# Patient Record
Sex: Male | Born: 1935 | Race: White | Hispanic: No | Smoking: Former smoker
Health system: Southern US, Community
[De-identification: ages and names within clinical notes are randomized; demographics above are authoritative.]

## PROBLEM LIST (undated history)

## (undated) DIAGNOSIS — Z8673 Personal history of transient ischemic attack (TIA), and cerebral infarction without residual deficits: Secondary | ICD-10-CM

## (undated) DIAGNOSIS — F0390 Unspecified dementia without behavioral disturbance: Secondary | ICD-10-CM

## (undated) DIAGNOSIS — K635 Polyp of colon: Secondary | ICD-10-CM

## (undated) DIAGNOSIS — J45909 Unspecified asthma, uncomplicated: Secondary | ICD-10-CM

## (undated) DIAGNOSIS — C439 Malignant melanoma of skin, unspecified: Secondary | ICD-10-CM

## (undated) DIAGNOSIS — C61 Malignant neoplasm of prostate: Secondary | ICD-10-CM

## (undated) DIAGNOSIS — N200 Calculus of kidney: Secondary | ICD-10-CM

## (undated) DIAGNOSIS — M722 Plantar fascial fibromatosis: Secondary | ICD-10-CM

## (undated) DIAGNOSIS — M949 Disorder of cartilage, unspecified: Secondary | ICD-10-CM

## (undated) DIAGNOSIS — K5790 Diverticulosis of intestine, part unspecified, without perforation or abscess without bleeding: Secondary | ICD-10-CM

## (undated) DIAGNOSIS — L309 Dermatitis, unspecified: Secondary | ICD-10-CM

## (undated) DIAGNOSIS — G4700 Insomnia, unspecified: Secondary | ICD-10-CM

## (undated) DIAGNOSIS — E559 Vitamin D deficiency, unspecified: Secondary | ICD-10-CM

## (undated) DIAGNOSIS — C434 Malignant melanoma of scalp and neck: Secondary | ICD-10-CM

## (undated) DIAGNOSIS — E538 Deficiency of other specified B group vitamins: Secondary | ICD-10-CM

## (undated) DIAGNOSIS — H409 Unspecified glaucoma: Secondary | ICD-10-CM

## (undated) DIAGNOSIS — Z85828 Personal history of other malignant neoplasm of skin: Secondary | ICD-10-CM

## (undated) DIAGNOSIS — Z85820 Personal history of malignant melanoma of skin: Secondary | ICD-10-CM

## (undated) DIAGNOSIS — M899 Disorder of bone, unspecified: Secondary | ICD-10-CM

## (undated) DIAGNOSIS — R32 Unspecified urinary incontinence: Secondary | ICD-10-CM

## (undated) HISTORY — DX: Deficiency of other specified B group vitamins: E53.8

## (undated) HISTORY — DX: Diverticulosis of intestine, part unspecified, without perforation or abscess without bleeding: K57.90

## (undated) HISTORY — DX: Insomnia, unspecified: G47.00

## (undated) HISTORY — DX: Dermatitis, unspecified: L30.9

## (undated) HISTORY — DX: Unspecified urinary incontinence: R32

## (undated) HISTORY — PX: INGUINAL HERNIA REPAIR: SHX194

## (undated) HISTORY — DX: Calculus of kidney: N20.0

## (undated) HISTORY — PX: CATARACT EXTRACTION W/ INTRAOCULAR LENS  IMPLANT, BILATERAL: SHX1307

## (undated) HISTORY — DX: Disorder of cartilage, unspecified: M94.9

## (undated) HISTORY — PX: PROSTATECTOMY: SHX69

## (undated) HISTORY — DX: Malignant neoplasm of prostate: C61

## (undated) HISTORY — DX: Personal history of malignant melanoma of skin: Z85.820

## (undated) HISTORY — DX: Personal history of transient ischemic attack (TIA), and cerebral infarction without residual deficits: Z86.73

## (undated) HISTORY — DX: Polyp of colon: K63.5

## (undated) HISTORY — DX: Vitamin D deficiency, unspecified: E55.9

## (undated) HISTORY — DX: Disorder of bone, unspecified: M89.9

## (undated) HISTORY — DX: Personal history of other malignant neoplasm of skin: Z85.828

## (undated) HISTORY — DX: Malignant melanoma of scalp and neck: C43.4

---

## 2003-09-23 ENCOUNTER — Encounter: Admission: RE | Admit: 2003-09-23 | Discharge: 2003-09-23 | Payer: Self-pay | Admitting: Internal Medicine

## 2004-04-06 ENCOUNTER — Emergency Department (HOSPITAL_COMMUNITY): Admission: EM | Admit: 2004-04-06 | Discharge: 2004-04-06 | Payer: Self-pay | Admitting: Emergency Medicine

## 2009-08-08 ENCOUNTER — Emergency Department (HOSPITAL_COMMUNITY): Admission: EM | Admit: 2009-08-08 | Discharge: 2009-08-08 | Payer: Self-pay | Admitting: Emergency Medicine

## 2009-09-20 HISTORY — PX: KIDNEY STONE SURGERY: SHX686

## 2009-09-22 ENCOUNTER — Ambulatory Visit (HOSPITAL_BASED_OUTPATIENT_CLINIC_OR_DEPARTMENT_OTHER): Admission: RE | Admit: 2009-09-22 | Discharge: 2009-09-22 | Payer: Self-pay | Admitting: Urology

## 2010-12-06 LAB — POCT HEMOGLOBIN-HEMACUE: Hemoglobin: 15.3 g/dL (ref 13.0–17.0)

## 2010-12-23 LAB — COMPREHENSIVE METABOLIC PANEL
ALT: 13 U/L (ref 0–53)
AST: 21 U/L (ref 0–37)
CO2: 31 mEq/L (ref 19–32)
Calcium: 9.7 mg/dL (ref 8.4–10.5)
Creatinine, Ser: 0.98 mg/dL (ref 0.4–1.5)
GFR calc Af Amer: >60 mL/min (ref >60)
GFR calc non Af Amer: >60 mL/min (ref >60)
Sodium: 141 mEq/L (ref 135–145)
Total Protein: 6.7 g/dL (ref 6.0–8.3)

## 2010-12-23 LAB — URINALYSIS, ROUTINE W REFLEX MICROSCOPIC
Glucose, UA: NEGATIVE mg/dL
Nitrite: NEGATIVE
Specific Gravity, Urine: 1.028 (ref 1.005–1.030)
pH: 7.5 (ref 5.0–8.0)

## 2010-12-23 LAB — CBC
HCT: 43.3 % (ref 39.0–52.0)
Hemoglobin: 14.9 g/dL (ref 13.0–17.0)
WBC: 7.2 10*3/uL (ref 4.0–10.5)

## 2010-12-23 LAB — LIPASE, BLOOD: Lipase: 30 U/L (ref 11–59)

## 2010-12-23 LAB — DIFFERENTIAL
Eosinophils Relative: 2 % (ref 0–5)
Lymphocytes Relative: 13 % (ref 12–46)
Lymphs Abs: 0.9 10*3/uL (ref 0.7–4.0)
Monocytes Relative: 6 % (ref 3–12)
Neutrophils Relative %: 80 % — ABNORMAL HIGH (ref 43–77)

## 2011-01-26 ENCOUNTER — Other Ambulatory Visit: Payer: Self-pay | Admitting: Internal Medicine

## 2011-01-26 DIAGNOSIS — N63 Unspecified lump in unspecified breast: Secondary | ICD-10-CM

## 2011-01-26 DIAGNOSIS — N644 Mastodynia: Secondary | ICD-10-CM

## 2011-02-01 ENCOUNTER — Other Ambulatory Visit: Payer: Self-pay | Admitting: Internal Medicine

## 2011-02-01 ENCOUNTER — Ambulatory Visit
Admission: RE | Admit: 2011-02-01 | Discharge: 2011-02-01 | Disposition: A | Discharge status: Home / Self Care | Payer: Medicare Other | Source: Ambulatory Visit | Attending: Internal Medicine | Admitting: Internal Medicine

## 2011-02-01 DIAGNOSIS — N644 Mastodynia: Secondary | ICD-10-CM

## 2011-02-01 DIAGNOSIS — N63 Unspecified lump in unspecified breast: Secondary | ICD-10-CM

## 2011-02-02 ENCOUNTER — Other Ambulatory Visit: Payer: Medicare Other

## 2011-02-08 ENCOUNTER — Ambulatory Visit
Admission: RE | Admit: 2011-02-08 | Discharge: 2011-02-08 | Disposition: A | Discharge status: Home / Self Care | Payer: Medicare Other | Source: Ambulatory Visit | Attending: Internal Medicine | Admitting: Internal Medicine

## 2011-02-08 ENCOUNTER — Other Ambulatory Visit: Payer: Self-pay | Admitting: Diagnostic Radiology

## 2011-02-08 DIAGNOSIS — N63 Unspecified lump in unspecified breast: Secondary | ICD-10-CM

## 2011-02-08 DIAGNOSIS — N644 Mastodynia: Secondary | ICD-10-CM

## 2011-02-09 ENCOUNTER — Other Ambulatory Visit: Payer: Self-pay | Admitting: Internal Medicine

## 2011-02-09 ENCOUNTER — Ambulatory Visit
Admission: RE | Admit: 2011-02-09 | Discharge: 2011-02-09 | Disposition: A | Discharge status: Home / Self Care | Payer: Medicare Other | Source: Ambulatory Visit | Attending: Internal Medicine | Admitting: Internal Medicine

## 2011-02-09 DIAGNOSIS — N63 Unspecified lump in unspecified breast: Secondary | ICD-10-CM

## 2012-01-14 ENCOUNTER — Other Ambulatory Visit: Payer: Self-pay | Admitting: Dermatology

## 2012-04-19 ENCOUNTER — Other Ambulatory Visit: Payer: Self-pay | Admitting: Internal Medicine

## 2012-04-19 DIAGNOSIS — M542 Cervicalgia: Secondary | ICD-10-CM

## 2012-04-25 ENCOUNTER — Other Ambulatory Visit: Payer: Medicare Other

## 2012-04-26 ENCOUNTER — Other Ambulatory Visit: Payer: Medicare Other

## 2012-07-13 ENCOUNTER — Other Ambulatory Visit: Payer: Self-pay | Admitting: Dermatology

## 2012-08-09 ENCOUNTER — Other Ambulatory Visit: Payer: Self-pay | Admitting: Dermatology

## 2012-10-10 ENCOUNTER — Other Ambulatory Visit: Payer: Self-pay | Admitting: Dermatology

## 2012-10-15 ENCOUNTER — Encounter (HOSPITAL_COMMUNITY): Payer: Self-pay | Admitting: Emergency Medicine

## 2012-10-15 ENCOUNTER — Emergency Department (HOSPITAL_COMMUNITY): Payer: Medicare Other

## 2012-10-15 ENCOUNTER — Emergency Department (HOSPITAL_COMMUNITY)
Admission: EM | Admit: 2012-10-15 | Discharge: 2012-10-16 | Disposition: A | Discharge status: Home / Self Care | Payer: Medicare Other | Attending: Emergency Medicine | Admitting: Emergency Medicine

## 2012-10-15 DIAGNOSIS — J45909 Unspecified asthma, uncomplicated: Secondary | ICD-10-CM | POA: Insufficient documentation

## 2012-10-15 DIAGNOSIS — Z7983 Long term (current) use of bisphosphonates: Secondary | ICD-10-CM | POA: Insufficient documentation

## 2012-10-15 DIAGNOSIS — N201 Calculus of ureter: Secondary | ICD-10-CM | POA: Insufficient documentation

## 2012-10-15 DIAGNOSIS — Z7982 Long term (current) use of aspirin: Secondary | ICD-10-CM | POA: Insufficient documentation

## 2012-10-15 DIAGNOSIS — Z85828 Personal history of other malignant neoplasm of skin: Secondary | ICD-10-CM | POA: Insufficient documentation

## 2012-10-15 DIAGNOSIS — N1330 Unspecified hydronephrosis: Secondary | ICD-10-CM | POA: Insufficient documentation

## 2012-10-15 DIAGNOSIS — Z79899 Other long term (current) drug therapy: Secondary | ICD-10-CM | POA: Insufficient documentation

## 2012-10-15 DIAGNOSIS — Z8546 Personal history of malignant neoplasm of prostate: Secondary | ICD-10-CM | POA: Insufficient documentation

## 2012-10-15 DIAGNOSIS — IMO0002 Reserved for concepts with insufficient information to code with codable children: Secondary | ICD-10-CM | POA: Insufficient documentation

## 2012-10-15 DIAGNOSIS — N132 Hydronephrosis with renal and ureteral calculous obstruction: Secondary | ICD-10-CM

## 2012-10-15 HISTORY — DX: Malignant neoplasm of prostate: C61

## 2012-10-15 HISTORY — DX: Malignant melanoma of skin, unspecified: C43.9

## 2012-10-15 HISTORY — DX: Unspecified asthma, uncomplicated: J45.909

## 2012-10-15 LAB — URINALYSIS, ROUTINE W REFLEX MICROSCOPIC
Glucose, UA: NEGATIVE mg/dL
Hgb urine dipstick: NEGATIVE
Ketones, ur: NEGATIVE mg/dL
Protein, ur: NEGATIVE mg/dL
pH: 7 (ref 5.0–8.0)

## 2012-10-15 LAB — COMPREHENSIVE METABOLIC PANEL
Albumin: 3.4 g/dL — ABNORMAL LOW (ref 3.5–5.2)
Alkaline Phosphatase: 59 U/L (ref 39–117)
BUN: 21 mg/dL (ref 6–23)
CO2: 27 mEq/L (ref 19–32)
Chloride: 102 mEq/L (ref 96–112)
GFR calc Af Amer: 66 mL/min — ABNORMAL LOW (ref >90)
GFR calc non Af Amer: 57 mL/min — ABNORMAL LOW (ref >90)
Glucose, Bld: 129 mg/dL — ABNORMAL HIGH (ref 70–99)
Potassium: 5.7 mEq/L — ABNORMAL HIGH (ref 3.5–5.1)
Total Bilirubin: 0.4 mg/dL (ref 0.3–1.2)

## 2012-10-15 LAB — CBC WITH DIFFERENTIAL/PLATELET
HCT: 38.4 % — ABNORMAL LOW (ref 39.0–52.0)
Hemoglobin: 13.5 g/dL (ref 13.0–17.0)
Lymphocytes Relative: 9 % — ABNORMAL LOW (ref 12–46)
Lymphs Abs: 0.8 10*3/uL (ref 0.7–4.0)
Monocytes Relative: 6 % (ref 3–12)
Neutro Abs: 7.9 10*3/uL — ABNORMAL HIGH (ref 1.7–7.7)
Neutrophils Relative %: 84 % — ABNORMAL HIGH (ref 43–77)
RBC: 4 MIL/uL — ABNORMAL LOW (ref 4.22–5.81)

## 2012-10-15 LAB — LIPASE, BLOOD: Lipase: 22 U/L (ref 11–59)

## 2012-10-15 MED ORDER — FINASTERIDE 5 MG PO TABS
5.0000 mg | ORAL_TABLET | Freq: Every day | ORAL | Status: DC
  Filled 2012-10-15: qty 1

## 2012-10-15 MED ORDER — DEXTROSE 5 % IV BOLUS
1000.0000 mL | Freq: Once | INTRAVENOUS | Status: AC
  Administered 2012-10-15: 1000 mL via INTRAVENOUS

## 2012-10-15 MED ORDER — VITAMIN B-12 1000 MCG PO TABS
1000.0000 ug | ORAL_TABLET | Freq: Every day | ORAL | Status: DC
  Filled 2012-10-15: qty 1

## 2012-10-15 MED ORDER — VITAMIN D3 25 MCG (1000 UNIT) PO TABS
1000.0000 [IU] | ORAL_TABLET | Freq: Every day | ORAL | Status: DC
  Filled 2012-10-15: qty 1

## 2012-10-15 MED ORDER — SODIUM CHLORIDE 0.9 % IV SOLN
1.0000 g | Freq: Once | INTRAVENOUS | Status: DC
  Filled 2012-10-15: qty 10

## 2012-10-15 MED ORDER — BUDESONIDE-FORMOTEROL FUMARATE 160-4.5 MCG/ACT IN AERO
2.0000 | INHALATION_SPRAY | Freq: Two times a day (BID) | RESPIRATORY_TRACT | Status: DC
  Filled 2012-10-15: qty 6

## 2012-10-15 MED ORDER — POLYETHYLENE GLYCOL 3350 17 G PO PACK
17.0000 g | PACK | Freq: Every day | ORAL | Status: DC
  Filled 2012-10-15: qty 1

## 2012-10-15 MED ORDER — ESCITALOPRAM OXALATE 10 MG PO TABS: 5.0000 mg | ORAL_TABLET | Freq: Every morning | ORAL | Status: DC

## 2012-10-15 MED ORDER — RIVASTIGMINE 9.5 MG/24HR TD PT24
9.5000 mg | MEDICATED_PATCH | Freq: Every day | TRANSDERMAL | Status: DC
  Filled 2012-10-15: qty 1

## 2012-10-15 MED ORDER — DOCUSATE SODIUM 100 MG PO CAPS: 200.0000 mg | ORAL_CAPSULE | Freq: Every day | ORAL | Status: DC

## 2012-10-15 MED ORDER — INSULIN ASPART 100 UNIT/ML ~~LOC~~ SOLN
10.0000 [IU] | Freq: Once | SUBCUTANEOUS | Status: AC
  Administered 2012-10-15: 10 [IU] via INTRAVENOUS
  Filled 2012-10-15: qty 1

## 2012-10-15 MED ORDER — MEMANTINE HCL 10 MG PO TABS
10.0000 mg | ORAL_TABLET | Freq: Two times a day (BID) | ORAL | Status: DC
  Filled 2012-10-15 (×3): qty 1

## 2012-10-15 MED ORDER — LATANOPROST 0.005 % OP SOLN
1.0000 [drp] | Freq: Every day | OPHTHALMIC | Status: DC
  Filled 2012-10-15: qty 2.5

## 2012-10-15 MED ORDER — MORPHINE SULFATE 4 MG/ML IJ SOLN
4.0000 mg | Freq: Once | INTRAMUSCULAR | Status: AC
  Administered 2012-10-15: 4 mg via INTRAVENOUS
  Filled 2012-10-15: qty 1

## 2012-10-15 MED ORDER — SENNOSIDES-DOCUSATE SODIUM 8.6-50 MG PO TABS: 1.0000 | ORAL_TABLET | Freq: Two times a day (BID) | ORAL | Status: DC

## 2012-10-15 MED ORDER — ASPIRIN EC 81 MG PO TBEC
81.0000 mg | DELAYED_RELEASE_TABLET | Freq: Every day | ORAL | Status: DC
  Filled 2012-10-15: qty 1

## 2012-10-15 MED ORDER — CALCIUM CITRATE-VITAMIN D 315-250 MG-UNIT PO TABS: 2.0000 | ORAL_TABLET | Freq: Every day | ORAL | Status: DC

## 2012-10-15 MED ORDER — CALCIUM CARBONATE-VITAMIN D 500-200 MG-UNIT PO TABS
1.0000 | ORAL_TABLET | Freq: Every day | ORAL | Status: DC
  Filled 2012-10-15: qty 1

## 2012-10-15 NOTE — ED Notes (Signed)
Report given via EMS. Pt c/o right flank pain (7/10 at worse) started in the AM. Pt was given tylenol for pain 650 last given at 1830. No change in pain. No change in palpation, no tenderness, no bruising, no trauma, no hx of kidney stones. No painful urination or hematuria. Hx of dementia, melanoma on scalp, asthma, and prostatic adenocarcinoma. Initial VS 150 palpated 66 pulse 14 RR no SOB, no weakness, ambulatory at 2039.

## 2012-10-15 NOTE — ED Provider Notes (Signed)
History     CSN: [id number]  Arrival date & time 10/15/12  2102   First MD Initiated Contact with Patient 10/15/12 2127      Chief Complaint  Patient presents with  . Flank Pain    HPI  The patient presents with right flank pain.  Pain began approximately 36 hours ago.  Since onset has been constant, pressure-like.  The pain is nonradiating.  There is no associated nausea, dysuria, hematuria.  No other abdominal pain, no chest pain that is new, no dyspnea.  No fever, no chills.  No relief with anything.  Past Medical History  Diagnosis Date  . Asthma   . Melanoma   . Prostatic adenocarcinoma     History reviewed. No pertinent past surgical history.  No family history on file.  History  Substance Use Topics  . Smoking status: Not on file  . Smokeless tobacco: Not on file  . Alcohol Use:       Review of Systems  Constitutional:       Per HPI, otherwise negative  HENT:       Per HPI, otherwise negative  Eyes: Negative.   Respiratory:       Per HPI, otherwise negative  Cardiovascular:       Per HPI, otherwise negative  Gastrointestinal: Negative for vomiting.  Genitourinary: Negative for penile swelling, scrotal swelling, penile pain and testicular pain.       History of present illness  Musculoskeletal:       Per HPI, otherwise negative  Skin: Negative.   Neurological: Negative for syncope.    Allergies  Sulfa antibiotics  Home Medications   Current Outpatient Rx  Name  Route  Sig  Dispense  Refill  . ALENDRONATE SODIUM 70 MG PO TABS   Oral   Take 70 mg by mouth every Saturday at 6 PM. Take with a full glass of water on an empty stomach. 30 minutes before meds and food.         . ASPIRIN EC 81 MG PO TBEC   Oral   Take 81 mg by mouth daily.         . BUDESONIDE-FORMOTEROL FUMARATE 160-4.5 MCG/ACT IN AERO   Inhalation   Inhale 2 puffs into the lungs 2 (two) times daily.         Marland Kitchen CALCIUM CITRATE-VITAMIN D 315-250 MG-UNIT PO TABS   Oral  Take 2 tablets by mouth daily with breakfast.         . VITAMIN D 1000 UNITS PO TABS   Oral   Take 1,000 Units by mouth daily.         Marland Kitchen DOCUSATE SODIUM 100 MG PO CAPS   Oral   Take 200 mg by mouth daily.         Marland Kitchen ESCITALOPRAM OXALATE 5 MG PO TABS   Oral   Take 5 mg by mouth every morning.         Marland Kitchen FINASTERIDE 5 MG PO TABS   Oral   Take 5 mg by mouth daily.         Marland Kitchen LATANOPROST 0.005 % OP SOLN   Both Eyes   Place 1 drop into both eyes at bedtime.         Marland Kitchen MEMANTINE HCL 10 MG PO TABS   Oral   Take 10 mg by mouth 2 (two) times daily.         . CENTRUM SILVER ADULT 50+ PO   Oral  Take 1 tablet by mouth daily.         Marland Kitchen PRESERVISION AREDS 2 PO   Oral   Take 1 capsule by mouth daily.         Marland Kitchen OVER THE COUNTER MEDICATION   Oral   Take 2 oz by mouth daily at 6 PM. 1 oz of gin mix in with 1 capful of [place] every evening at 5pm         . POLYETHYLENE GLYCOL 3350 PO PACK   Oral   Take 17 g by mouth daily.         Marland Kitchen RIVASTIGMINE 9.5 MG/24HR TD PT24   Transdermal   Place 1 patch onto the skin daily. Rotate site 14 times before returning to original site         . SENNOSIDES-DOCUSATE SODIUM 8.6-50 MG PO TABS   Oral   Take 1 tablet by mouth 2 (two) times daily.         Marland Kitchen VITAMIN B-12 1000 MCG PO TABS   Oral   Take 1,000 mcg by mouth daily.           BP 157/66  Pulse 62  Temp 98.2 F (36.8 C) (Oral)  Resp 18  Ht 5\' 8"  (1.727 m)  Wt 125 lb (56.7 kg)  BMI 19.01 kg/m2  SpO2 99%  Physical Exam  Nursing note and vitals reviewed. Constitutional: He is oriented to person, place, and time. He appears well-developed. No distress.  HENT:  Head: Normocephalic and atraumatic.  Eyes: Conjunctivae normal and EOM are normal.  Cardiovascular: Normal rate and regular rhythm.   Pulmonary/Chest: Effort normal. No stridor. No respiratory distress.  Abdominal: He exhibits no distension. There is no tenderness. There is CVA tenderness. There  is no rigidity and no guarding.  Musculoskeletal: He exhibits no edema.  Neurological: He is alert and oriented to person, place, and time.  Skin: Skin is warm and dry.  Psychiatric: He has a normal mood and affect.    ED Course  Procedures (including critical care time)  Labs Reviewed  CBC WITH DIFFERENTIAL - Abnormal; Notable for the following:    RBC 4.00 (*)     HCT 38.4 (*)     Neutrophils Relative 84 (*)     Neutro Abs 7.9 (*)     Lymphocytes Relative 9 (*)     All other components within normal limits  COMPREHENSIVE METABOLIC PANEL  LIPASE, BLOOD  URINALYSIS, ROUTINE W REFLEX MICROSCOPIC   No results found.   No diagnosis found.  Oxygen 99% room air normal  Initial labs notable for hyperkalemia.   Date: 10/15/2012  Rate: 62  Rhythm: normal sinus rhythm  QRS Axis: left  Intervals: PR prolonged  ST/T Wave abnormalities: peaked T waves  Conduction Disutrbances:left anterior fascicular block  Narrative Interpretation:   Old EKG Reviewed: changes noted  Peaked t waves from prior - abnormal   Hyperkalemia meds provided.  11:09 PM Patient appears comfortable.   MDM  This elderly male now presents with concerns of right flank pain.  Given the patient's dementia, history of present illness is questionable.  The patient's labs were notable for demonstration of hyperkalemia, and his ECG showed appropriate changes.  The patient received glucose, calcium, insulin and was admitted for further E/M. He remained largely HD stable throughout his ED stay.  CRITICAL CARE Performed by: Gerhard Munch   Total critical care time: 35  Critical care time was exclusive of separately billable procedures and treating  other patients.  Critical care was necessary to treat or prevent imminent or life-threatening deterioration.  Critical care was time spent personally by me on the following activities: development of treatment plan with patient and/or surrogate as well as  nursing, discussions with consultants, evaluation of patient's response to treatment, examination of patient, obtaining history from patient or surrogate, ordering and performing treatments and interventions, ordering and review of laboratory studies, ordering and review of radiographic studies, pulse oximetry and re-evaluation of patient's condition.       Gerhard Munch, MD 10/15/12 2310

## 2012-10-15 NOTE — ED Notes (Signed)
Pt back from XR 

## 2012-10-15 NOTE — ED Notes (Signed)
[id number]> Expected date:<BR> Expected time:<BR> Means of arrival:<BR> Comments:<BR> 44 male, R flank pain

## 2012-10-15 NOTE — ED Notes (Signed)
[facility].

## 2012-10-15 NOTE — ED Notes (Signed)
Pt transported to XR.  

## 2012-10-16 ENCOUNTER — Emergency Department (HOSPITAL_COMMUNITY): Payer: Medicare Other

## 2012-10-16 LAB — POTASSIUM: Potassium: 4.5 mEq/L (ref 3.5–5.1)

## 2012-10-16 MED ORDER — ONDANSETRON HCL 4 MG PO TABS: 4.0000 mg | ORAL_TABLET | Freq: Four times a day (QID) | ORAL | Status: DC

## 2012-10-16 MED ORDER — HYDROCODONE-ACETAMINOPHEN 5-325 MG PO TABS: 1.0000 | ORAL_TABLET | Freq: Four times a day (QID) | ORAL | Status: DC | PRN

## 2012-10-16 NOTE — ED Notes (Signed)
Pt back from CT

## 2012-10-16 NOTE — ED Notes (Signed)
Hospitalist at bedside 

## 2012-10-16 NOTE — ED Notes (Signed)
Patient transported to CT 

## 2012-10-16 NOTE — ED Provider Notes (Signed)
Evaluated by hospitalist, DR Beacher May, no hyperkalemia on repeat labs - suspected hemolysis. CT scan obtained and reviewed has h/o kidney stones and pain control achieved. Plan follow up Urology. Precautions provided.   Results for orders placed during the hospital encounter of 10/15/12  CBC WITH DIFFERENTIAL      Component Value Range   WBC 9.4  4.0 - 10.5 K/uL   RBC 4.00 (*) 4.22 - 5.81 MIL/uL   Hemoglobin 13.5  13.0 - 17.0 g/dL   HCT 96.0 (*) 45.4 - 09.8 %   MCV 96.0  78.0 - 100.0 fL   MCH 33.8  26.0 - 34.0 pg   MCHC 35.2  30.0 - 36.0 g/dL   RDW 11.9  14.7 - 82.9 %   Platelets 219  150 - 400 K/uL   Neutrophils Relative 84 (*) 43 - 77 %   Neutro Abs 7.9 (*) 1.7 - 7.7 K/uL   Lymphocytes Relative 9 (*) 12 - 46 %   Lymphs Abs 0.8  0.7 - 4.0 K/uL   Monocytes Relative 6  3 - 12 %   Monocytes Absolute 0.6  0.1 - 1.0 K/uL   Eosinophils Relative 1  0 - 5 %   Eosinophils Absolute 0.1  0.0 - 0.7 K/uL   Basophils Relative 0  0 - 1 %   Basophils Absolute 0.0  0.0 - 0.1 K/uL  COMPREHENSIVE METABOLIC PANEL      Component Value Range   Sodium 137  135 - 145 mEq/L   Potassium 5.7 (*) 3.5 - 5.1 mEq/L   Chloride 102  96 - 112 mEq/L   CO2 27  19 - 32 mEq/L   Glucose, Bld 129 (*) 70 - 99 mg/dL   BUN 21  6 - 23 mg/dL   Creatinine, Ser 5.62  0.50 - 1.35 mg/dL   Calcium 13.0  8.4 - 86.5 mg/dL   Total Protein 6.7  6.0 - 8.3 g/dL   Albumin 3.4 (*) 3.5 - 5.2 g/dL   AST 32  0 - 37 U/L   ALT 12  0 - 53 U/L   Alkaline Phosphatase 59  39 - 117 U/L   Total Bilirubin 0.4  0.3 - 1.2 mg/dL   GFR calc non Af Amer 57 (*) >90 mL/min   GFR calc Af Amer 66 (*) >90 mL/min  LIPASE, BLOOD      Component Value Range   Lipase 22  11 - 59 U/L  URINALYSIS, ROUTINE W REFLEX MICROSCOPIC      Component Value Range   Color, Urine YELLOW  YELLOW   APPearance CLOUDY (*) CLEAR   Specific Gravity, Urine 1.018  1.005 - 1.030   pH 7.0  5.0 - 8.0   Glucose, UA NEGATIVE  NEGATIVE mg/dL   Hgb urine dipstick NEGATIVE   NEGATIVE   Bilirubin Urine NEGATIVE  NEGATIVE   Ketones, ur NEGATIVE  NEGATIVE mg/dL   Protein, ur NEGATIVE  NEGATIVE mg/dL   Urobilinogen, UA 0.2  0.0 - 1.0 mg/dL   Nitrite NEGATIVE  NEGATIVE   Leukocytes, UA NEGATIVE  NEGATIVE  POTASSIUM      Component Value Range   Potassium 4.5  3.5 - 5.1 mEq/L   Ct Abdomen Pelvis Wo Contrast  10/16/2012  *RADIOLOGY REPORT*  Clinical Data: Right flank pain  CT ABDOMEN AND PELVIS WITHOUT CONTRAST  Technique:  Multidetector CT imaging of the abdomen and pelvis was performed following the standard protocol without intravenous contrast.  Comparison: 08/08/2009  Findings: Aortic valve  calcification and coronary artery calcification is partially imaged.  Mild bibasilar opacities.  8 mm subpleural nodule right lower lobe on image three, measured similar previously.  Organ abnormality/lesion detection is limited in the absence of intravenous contrast. Within this limitation, unremarkable liver, biliary system, spleen, pancreas, adrenal glands.  Bilateral nonobstructing renal stones.  Left renal cyst anteriorly. There is mild right hydroureteronephrosis to the level of a 5 mm mid right ureteral stone.  No CT evidence for colitis.  Redundant sigmoid colon.  Appendix not confidently identified.  No right lower quadrant inflammation.  No bowel obstruction.  No free intraperitoneal air or fluid.  Scattered atherosclerotic disease of the aorta and branch vessels.  Thin-walled bladder.  Prostatectomy and bilateral pelvic sidewall nodal dissection.  Multilevel degenerative changes of the imaged spine. No acute or aggressive appearing osseous lesion.  IMPRESSION: Mild right hydroureteronephrosis to the level of a 5 mm mid right ureteral stone.  Additional bilateral nonobstructing renal stones.   Original Report Authenticated By: Jearld Lesch, M.D.        Sunnie Nielsen, MD 10/16/12 [phone]

## 2012-10-16 NOTE — ED Notes (Signed)
Spoke with Dr. Julian Reil. Informed to D/C all orders. Pt most likely being discharged after CT scan.

## 2012-11-14 ENCOUNTER — Emergency Department (HOSPITAL_COMMUNITY)
Admission: EM | Admit: 2012-11-14 | Discharge: 2012-11-14 | Disposition: A | Discharge status: Home / Self Care | Payer: Medicare Other | Attending: Emergency Medicine | Admitting: Emergency Medicine

## 2012-11-14 ENCOUNTER — Emergency Department (HOSPITAL_COMMUNITY): Payer: Medicare Other

## 2012-11-14 ENCOUNTER — Encounter (HOSPITAL_COMMUNITY): Payer: Self-pay | Admitting: Emergency Medicine

## 2012-11-14 DIAGNOSIS — IMO0002 Reserved for concepts with insufficient information to code with codable children: Secondary | ICD-10-CM | POA: Insufficient documentation

## 2012-11-14 DIAGNOSIS — W19XXXA Unspecified fall, initial encounter: Secondary | ICD-10-CM

## 2012-11-14 DIAGNOSIS — Z8546 Personal history of malignant neoplasm of prostate: Secondary | ICD-10-CM | POA: Insufficient documentation

## 2012-11-14 DIAGNOSIS — Z7982 Long term (current) use of aspirin: Secondary | ICD-10-CM | POA: Insufficient documentation

## 2012-11-14 DIAGNOSIS — Y939 Activity, unspecified: Secondary | ICD-10-CM | POA: Insufficient documentation

## 2012-11-14 DIAGNOSIS — Y9210 Unspecified residential institution as the place of occurrence of the external cause: Secondary | ICD-10-CM | POA: Insufficient documentation

## 2012-11-14 DIAGNOSIS — Z79899 Other long term (current) drug therapy: Secondary | ICD-10-CM | POA: Insufficient documentation

## 2012-11-14 DIAGNOSIS — F0390 Unspecified dementia without behavioral disturbance: Secondary | ICD-10-CM | POA: Insufficient documentation

## 2012-11-14 DIAGNOSIS — J45909 Unspecified asthma, uncomplicated: Secondary | ICD-10-CM | POA: Insufficient documentation

## 2012-11-14 DIAGNOSIS — H40009 Preglaucoma, unspecified, unspecified eye: Secondary | ICD-10-CM | POA: Insufficient documentation

## 2012-11-14 DIAGNOSIS — R296 Repeated falls: Secondary | ICD-10-CM | POA: Insufficient documentation

## 2012-11-14 HISTORY — DX: Unspecified glaucoma: H40.9

## 2012-11-14 HISTORY — DX: Unspecified dementia, unspecified severity, without behavioral disturbance, psychotic disturbance, mood disturbance, and anxiety: F03.90

## 2012-11-14 HISTORY — DX: Plantar fascial fibromatosis: M72.2

## 2012-11-14 NOTE — ED Provider Notes (Signed)
History     CSN: [id number]  Arrival date & time 11/14/12  1301   First MD Initiated Contact with Patient 11/14/12 1350      Chief Complaint  Patient presents with  . Fall  . Back Pain    (Consider location/radiation/quality/duration/timing/severity/associated sxs/prior treatment) Patient is a 79 y.o. male presenting with fall and back pain. The history is provided by the patient and the nursing home.  Fall Pertinent negatives include no numbness, no abdominal pain, no nausea, no vomiting and no headaches.  Back Pain Associated symptoms: no abdominal pain, no chest pain, no headaches, no numbness and no weakness    patient reportedly fell backwards while at the nursing home. No loss of consciousness. Pain in his right lower back. No numbness or weakness. No bruising. No dominant. He did not hit his head. A chest pain. No difficulty breathing. No blood thinners.   Past Medical History  Diagnosis Date  . Asthma   . Melanoma   . Prostatic adenocarcinoma   . Dementia, unspecified, without behavioral disturbance   . Glaucoma   . Fibromatosis, plantar     History reviewed. No pertinent past surgical history.  History reviewed. No pertinent family history.  History  Substance Use Topics  . Smoking status: Never Smoker   . Smokeless tobacco: Not on file  . Alcohol Use: No      Review of Systems  Constitutional: Negative for activity change and appetite change.  HENT: Negative for neck stiffness.   Eyes: Negative for pain.  Respiratory: Negative for chest tightness and shortness of breath.   Cardiovascular: Negative for chest pain and leg swelling.  Gastrointestinal: Negative for nausea, vomiting, abdominal pain and diarrhea.  Genitourinary: Negative for flank pain.  Musculoskeletal: Positive for back pain.  Skin: Negative for rash.  Neurological: Negative for weakness, numbness and headaches.  Psychiatric/Behavioral: Negative for behavioral problems.    Allergies   Sulfa antibiotics  Home Medications   Current Outpatient Rx  Name  Route  Sig  Dispense  Refill  . alendronate (FOSAMAX) 70 MG tablet   Oral   Take 70 mg by mouth every Saturday at 6 PM. Take with a full glass of water on an empty stomach. 30 minutes before meds and food.         Marland Kitchen aspirin EC 81 MG tablet   Oral   Take 81 mg by mouth daily.         . budesonide-formoterol (SYMBICORT) 160-4.5 MCG/ACT inhaler   Inhalation   Inhale 2 puffs into the lungs daily. Wait 1 minute between puffs, rinse mouth after use         . Calcium Citrate-Vitamin D (CITRACAL MAXIMUM) 315-250 MG-UNIT TABS   Oral   Take 2 tablets by mouth daily with breakfast.         . cholecalciferol (VITAMIN D) 1000 UNITS tablet   Oral   Take 1,000 Units by mouth daily.         Marland Kitchen docusate sodium (COLACE) 100 MG capsule   Oral   Take 200 mg by mouth daily.         Marland Kitchen escitalopram (LEXAPRO) 5 MG tablet   Oral   Take 5 mg by mouth every morning.         . finasteride (PROSCAR) 5 MG tablet   Oral   Take 5 mg by mouth daily.         Marland Kitchen HYDROcodone-acetaminophen (NORCO/VICODIN) 5-325 MG per tablet   Oral  Take 1 tablet by mouth every 6 (six) hours as needed for pain.   15 tablet   0   . latanoprost (XALATAN) 0.005 % ophthalmic solution   Both Eyes   Place 1 drop into both eyes at bedtime.         . memantine (NAMENDA) 10 MG tablet   Oral   Take 10 mg by mouth 2 (two) times daily.         . Multiple Vitamins-Minerals (CENTRUM SILVER ADULT 50+ PO)   Oral   Take 1 tablet by mouth daily.         . Multiple Vitamins-Minerals (PRESERVISION AREDS 2 PO)   Oral   Take 1 capsule by mouth daily.         . ondansetron (ZOFRAN) 4 MG tablet   Oral   Take 1 tablet (4 mg total) by mouth every 6 (six) hours.   12 tablet   0   . OVER THE COUNTER MEDICATION   Oral   Take 2 oz by mouth daily at 6 PM. 1 oz of gin mix in with 1 capful of [place] every evening at 5pm         .  polyethylene glycol (MIRALAX / GLYCOLAX) packet   Oral   Take 17 g by mouth daily.         . rivastigmine (EXELON) 9.5 mg/24hr   Transdermal   Place 1 patch onto the skin daily. Rotate site 14 times before returning to original site         . senna-docusate (SENOKOT S) 8.6-50 MG per tablet   Oral   Take 1 tablet by mouth 2 (two) times daily.         . tamsulosin (FLOMAX) 0.4 MG CAPS   Oral   Take 0.4 mg by mouth daily.         . vitamin B-12 (CYANOCOBALAMIN) 1000 MCG tablet   Oral   Take 1,000 mcg by mouth daily.           BP 137/71  Pulse 68  Temp(Src) 97.6 F (36.4 C) (Oral)  Resp 16  SpO2 98%  Physical Exam  Nursing note and vitals reviewed. Constitutional: He appears well-developed and well-nourished.  HENT:  Head: Normocephalic and atraumatic.  Eyes: EOM are normal. Pupils are equal, round, and reactive to light.  Neck: Normal range of motion. Neck supple.  Cardiovascular: Normal rate, regular rhythm and normal heart sounds.   No murmur heard. Pulmonary/Chest: Effort normal and breath sounds normal.  Abdominal: Soft. Bowel sounds are normal. He exhibits no distension and no mass. There is no tenderness. There is no rebound and no guarding.  Musculoskeletal: Normal range of motion. He exhibits tenderness. He exhibits no edema.  Mild tenderness over right SI area.  Neurological: He is alert. No cranial nerve deficit.  Mild confusion, but appropriate. Reportedly at baseline  Skin: Skin is warm and dry.  Psychiatric: He has a normal mood and affect.    ED Course  Procedures (including critical care time)  Labs Reviewed - No data to display Dg Lumbar Spine Complete  11/14/2012  *RADIOLOGY REPORT*  Clinical Data: 79 year old male status post fall.  Lumbar pain radiating to the right hip and flank.  LUMBAR SPINE - COMPLETE 4+ VIEW  Comparison: CT abdomen and pelvis 08/08/2009.  Findings: Normal lumbar segmentation.  Stable lumbar vertebral height and  alignment and 2010.  Stable disc spaces.  No pars fracture.  Calcified atherosclerosis of the aorta  and iliac arteries.  Visible lower thoracic levels appear grossly stable. SI joints appear within normal limits.  IMPRESSION: No acute osseous abnormality in the lumbar spine.   Original Report Authenticated By: Erskine Speed, M.D.    Dg Pelvis 1-2 Views  11/14/2012  *RADIOLOGY REPORT*  Clinical Data: 79 year old male status post fall.  Pain radiating to the right flank and hip.  PELVIS - 1-2 VIEW  Comparison: [facility] KUB 11/08/2012.  Findings: Femoral heads normally located.  Hip joint spaces are stable.  Pelvic sidewall surgical clips are stable.  Pelvis intact. Sacral ala grossly intact.  Proximal femurs appear intact. Bilateral small pelvic phleboliths appear grossly stable.  IMPRESSION: No acute fracture or dislocation identified about the pelvis.   Original Report Authenticated By: Erskine Speed, M.D.      1. Fall, initial encounter       MDM  Patient presents with a mechanical fall. Some right pelvis pain. Negative x-rays and patient began to ambulate. He'll be discharged home.        Juliet Rude. Rubin Payor, MD 11/14/12 1610

## 2012-11-14 NOTE — ED Notes (Signed)
Pt from [facility].  Pt had a fall at 8am today.  Pt states he felt nauseous, stepped backwards and lost his balance.  Denies hitting head or LOC.  Pt unable to remember exactly how he landed.  Pt complains of R sided lower back pain.  Able to ambulate to bed. Pt states he does not feel nauseous now.  Denies V/D, chest pain or SOB.

## 2012-11-14 NOTE — ED Notes (Signed)
Patient transported to X-ray 

## 2012-11-14 NOTE — ED Notes (Signed)
Pt ambulated without difficulty

## 2012-12-14 ENCOUNTER — Encounter: Payer: Self-pay | Admitting: Nurse Practitioner

## 2012-12-14 ENCOUNTER — Non-Acute Institutional Stay: Payer: Medicare Other | Admitting: Nurse Practitioner

## 2012-12-14 DIAGNOSIS — F0390 Unspecified dementia without behavioral disturbance: Secondary | ICD-10-CM

## 2012-12-14 DIAGNOSIS — W19XXXA Unspecified fall, initial encounter: Secondary | ICD-10-CM

## 2012-12-14 DIAGNOSIS — N390 Urinary tract infection, site not specified: Secondary | ICD-10-CM

## 2012-12-14 DIAGNOSIS — C439 Malignant melanoma of skin, unspecified: Secondary | ICD-10-CM

## 2012-12-14 DIAGNOSIS — N200 Calculus of kidney: Secondary | ICD-10-CM

## 2012-12-14 NOTE — Progress Notes (Signed)
  Subjective:    Patient ID: Nathan Andrade., male    DOB: 12/14/1915, 79 y.o.   MRN: [id number]  HPI  Pleasant male resident resides at [facility] at [facility] switch medical service to [facility]. Had appointment 01/11/13 for establishment as a new patient. Staff requested evaluation for his falls x2, increased confusion and generalized weakness in the past 2 weeks. UA 12/10/12 showed 25,000c/ml enterococcus.   Review of Systems  Constitutional: Positive for appetite change and fatigue. Negative for activity change.  HENT: Positive for hearing loss. Negative for ear pain, congestion, neck pain, neck stiffness and sinus pressure.   Eyes: Negative for pain, discharge, redness, itching and visual disturbance.  Respiratory: Negative for apnea, cough, chest tightness, shortness of breath and wheezing.   Cardiovascular: Negative for chest pain, palpitations and leg swelling.  Gastrointestinal: Negative for abdominal pain, constipation and abdominal distention.  Endocrine: Negative for cold intolerance, heat intolerance, polydipsia, polyphagia and polyuria.  Genitourinary: Negative for urgency, frequency, flank pain and difficulty urinating.  Musculoskeletal: Positive for arthralgias and gait problem. Negative for back pain and joint swelling.  Skin: Negative for pallor, rash and wound.  Allergic/Immunologic: Negative.   Neurological: Negative for dizziness, tremors, syncope, facial asymmetry, speech difficulty, weakness, light-headedness and numbness.  Hematological: Negative.   Psychiatric/Behavioral: Positive for confusion. Negative for hallucinations, behavioral problems, sleep disturbance, dysphoric mood, decreased concentration and agitation. The patient is not nervous/anxious and is not hyperactive.        Objective:   Physical Exam  Constitutional: He is oriented to person, place, and time. He appears well-developed and well-nourished. No distress.  HENT:  Head: Normocephalic and atraumatic.  Eyes:  Conjunctivae and EOM are normal. Pupils are equal, round, and reactive to light.  Neck: Normal range of motion. Neck supple. No JVD present. No thyromegaly present.  Cardiovascular: Normal rate and regular rhythm.   No murmur heard. Pulmonary/Chest: Effort normal and breath sounds normal. He has no wheezes. He has no rales.  Abdominal: Soft. Bowel sounds are normal. There is no tenderness. There is no guarding.  Musculoskeletal: Normal range of motion. He exhibits no edema and no tenderness.  Lymphadenopathy:    He has no cervical adenopathy.  Neurological: He is alert and oriented to person, place, and time. He has normal reflexes. He displays normal reflexes. No cranial nerve deficit. He exhibits normal muscle tone. Coordination normal.  Skin: Skin is warm. No rash noted. He is not diaphoretic. No erythema.  Psychiatric: He has a normal mood and affect.          Assessment & Plan:   Dementia Progressed, continue Namenda, will treat UTI since the patient became weaker, fallx2, and more confused  UTI Even if enterococcus count is only 25,000c/mo, will treat with Ampicillin 500mg  q6hrs for 7 days along with FloraStor I bid  . Fall Generalized weakness vs neurological etiology--will observe since the patient is lack of focal neurological deficit at age of 61. Intensive supervision needed for safety. Will chck CBC/CMP/TSH  . Kidney stone F/u urology. Occasional abd pain.   . Melanoma of skin, site unspecified  hx of removal.

## 2012-12-15 NOTE — Progress Notes (Signed)
  Subjective:    Patient ID: Nathan Santee., male    DOB: 09-Jun-1916, 79 y.o.   MRN: [id number]  HPI    Review of Systems     Objective:   Physical Exam        Assessment & Plan:   This encounter was created in error - please disregard. This encounter was created in error - please disregard.

## 2012-12-19 LAB — CBC AND DIFFERENTIAL
HCT: 36 % — AB (ref 41–53)
Hemoglobin: 12.2 g/dL — AB (ref 13.5–17.5)
WBC: 6.8 10^3/mL

## 2012-12-22 ENCOUNTER — Non-Acute Institutional Stay: Payer: Medicare Other | Admitting: Nurse Practitioner

## 2012-12-22 DIAGNOSIS — N200 Calculus of kidney: Secondary | ICD-10-CM

## 2012-12-22 DIAGNOSIS — N390 Urinary tract infection, site not specified: Secondary | ICD-10-CM

## 2012-12-22 DIAGNOSIS — F0390 Unspecified dementia without behavioral disturbance: Secondary | ICD-10-CM

## 2012-12-22 NOTE — Progress Notes (Signed)
Subjective:    Patient ID: Nathan Andrade., male    DOB: 1916-06-17, 79 y.o.   MRN: [id number]  HPI  Meeting with the patient's dtr-HCPOA to address plan of care  Address abd pain(on and off--dtr suspected kidney stone), nausea, and poor appetite.    Asthma Synbicort   Melanoma hx of removal   Prostatic adenocarcinoma s/p radiation, presently taking Tamisulosin   Dementia, unspecified, without behavioral disturbance will stop Exelon and Namenda   Glaucoma eyedrops.    Fibromatosis, plantar stable.   Osteoporosis: has been on Alendronate for years   Depression: only takes Lexapro 5mg  started after his wife passed away about a year ago--denied depression.    Review of Systems  Constitutional: Positive for activity change (decreased, stays in bed most of time), appetite change (decreased), fatigue and unexpected weight change (weight loss). Negative for fever, chills and diaphoresis.  HENT: Positive for hearing loss (severe. Depended on his wife to communicate before she passed away). Negative for congestion, rhinorrhea, trouble swallowing, neck pain, neck stiffness, voice change and postnasal drip.   Eyes: Positive for visual disturbance (low vision). Negative for pain and redness.  Respiratory: Positive for cough and shortness of breath. Negative for choking, chest tightness and wheezing.   Cardiovascular: Negative for chest pain, palpitations and leg swelling.  Gastrointestinal: Positive for nausea. Negative for vomiting, diarrhea and constipation.  Endocrine: Negative for cold intolerance, heat intolerance, polydipsia, polyphagia and polyuria.  Genitourinary: Positive for frequency and flank pain (left on and off). Negative for dysuria and urgency.  Musculoskeletal: Positive for arthralgias and gait problem. Negative for myalgias, back pain and joint swelling.  Skin: Negative for pallor, rash and wound.  Allergic/Immunologic: Negative.   Neurological: Negative for tremors, syncope,  speech difficulty, weakness, numbness and headaches.  Hematological: Negative.   Psychiatric/Behavioral: Positive for confusion (related to dementia). Negative for hallucinations, behavioral problems, sleep disturbance, dysphoric mood and agitation. The patient is not nervous/anxious.        Objective:   Physical Exam  Constitutional: He is oriented to person, place, and time. He appears well-developed and well-nourished.  HENT:  Head: Normocephalic and atraumatic.  Eyes: Conjunctivae and EOM are normal. Pupils are equal, round, and reactive to light.  Neck: Normal range of motion. Neck supple. No JVD present. No thyromegaly present.  Cardiovascular: Normal rate, regular rhythm and normal heart sounds.   No murmur heard. Pulmonary/Chest: Effort normal. He has decreased breath sounds. He has no wheezes. He has no rales. He exhibits no tenderness.  Abdominal: Soft. Bowel sounds are normal. There is no tenderness.  Musculoskeletal: Normal range of motion. He exhibits no edema and no tenderness.  Lymphadenopathy:    He has no cervical adenopathy.  Neurological: He is alert and oriented to person, place, and time. He has normal reflexes. He displays normal reflexes. No cranial nerve deficit. He exhibits normal muscle tone. Coordination normal.  Skin: Skin is warm and dry. No rash noted. No erythema.  Psychiatric: His mood appears not anxious. His affect is not angry, not blunt, not labile and not inappropriate. His speech is not rapid and/or pressured, not delayed, not tangential and not slurred. He is slowed. He is not agitated, not aggressive, not hyperactive, not withdrawn, not actively hallucinating and not combative. Thought content is not paranoid and not delusional. Cognition and memory are impaired. He does not express impulsivity or inappropriate judgment. He does not exhibit a depressed mood. He exhibits abnormal recent memory.    Labs Reviewed:  12/19/12  CBC wbc 6.8, Hgb 12.2, Hct 35.6,  plt 217   CMP Na 139, K 4.4, glucose 84, Bun 12, creatinine 0.93, Ca 9.4, LFT wnl except total protein 5.3, albumin 3.0   TSH 1.698      Assessment & Plan:   . Dementia R>B since the patient experienced nausea frequently and advanced age of 76. Dc Exelon and Namenda.   FTT poor appetite, falling, ADL dependence, dementia, depression--comfort measures, dc ASA and MVI  Depression No apparent dc Lexapro.   Osteoporosis Has been on for years, ok to stop now, especially he is nauseated frequently  GERD Medication reduction, adding Prilosec 20mg  daily for 2weeks--then re evaluate.  . Fall Generalized weakness and lack of safety awareness, intensive supervision needed.   . Kidney stone Saw Urology in the past, will schedule Norco 1/2 tid for now  . Melanoma of skin, site unspecified hx of removal.   . UTI (urinary tract infection) treated clinical presumed UTI didn't improve his overall condition.

## 2013-01-11 ENCOUNTER — Non-Acute Institutional Stay: Payer: Medicare Other | Admitting: Nurse Practitioner

## 2013-01-11 ENCOUNTER — Encounter: Payer: Self-pay | Admitting: Nurse Practitioner

## 2013-01-11 VITALS — BP 130/68 | HR 80 | Ht 67.0 in | Wt 143.0 lb

## 2013-01-11 DIAGNOSIS — F329 Major depressive disorder, single episode, unspecified: Secondary | ICD-10-CM

## 2013-01-11 DIAGNOSIS — H409 Unspecified glaucoma: Secondary | ICD-10-CM

## 2013-01-11 DIAGNOSIS — J45909 Unspecified asthma, uncomplicated: Secondary | ICD-10-CM

## 2013-01-11 DIAGNOSIS — M722 Plantar fascial fibromatosis: Secondary | ICD-10-CM

## 2013-01-11 DIAGNOSIS — Z299 Encounter for prophylactic measures, unspecified: Secondary | ICD-10-CM

## 2013-01-11 DIAGNOSIS — C439 Malignant melanoma of skin, unspecified: Secondary | ICD-10-CM

## 2013-01-11 DIAGNOSIS — M810 Age-related osteoporosis without current pathological fracture: Secondary | ICD-10-CM

## 2013-01-11 DIAGNOSIS — R627 Adult failure to thrive: Secondary | ICD-10-CM

## 2013-01-11 DIAGNOSIS — N200 Calculus of kidney: Secondary | ICD-10-CM

## 2013-01-11 DIAGNOSIS — F0390 Unspecified dementia without behavioral disturbance: Secondary | ICD-10-CM

## 2013-01-11 DIAGNOSIS — K219 Gastro-esophageal reflux disease without esophagitis: Secondary | ICD-10-CM

## 2013-01-11 DIAGNOSIS — C61 Malignant neoplasm of prostate: Secondary | ICD-10-CM

## 2013-01-12 DIAGNOSIS — R627 Adult failure to thrive: Secondary | ICD-10-CM | POA: Insufficient documentation

## 2013-01-12 DIAGNOSIS — K219 Gastro-esophageal reflux disease without esophagitis: Secondary | ICD-10-CM | POA: Insufficient documentation

## 2013-01-12 DIAGNOSIS — C61 Malignant neoplasm of prostate: Secondary | ICD-10-CM | POA: Insufficient documentation

## 2013-01-12 DIAGNOSIS — J45909 Unspecified asthma, uncomplicated: Secondary | ICD-10-CM | POA: Insufficient documentation

## 2013-01-12 DIAGNOSIS — M810 Age-related osteoporosis without current pathological fracture: Secondary | ICD-10-CM | POA: Insufficient documentation

## 2013-01-12 DIAGNOSIS — M722 Plantar fascial fibromatosis: Secondary | ICD-10-CM | POA: Insufficient documentation

## 2013-01-12 DIAGNOSIS — F329 Major depressive disorder, single episode, unspecified: Secondary | ICD-10-CM | POA: Insufficient documentation

## 2013-01-12 DIAGNOSIS — H409 Unspecified glaucoma: Secondary | ICD-10-CM | POA: Insufficient documentation

## 2013-01-12 NOTE — Assessment & Plan Note (Signed)
Chronic. 

## 2013-01-12 NOTE — Assessment & Plan Note (Signed)
No pain on Norco 1/2 tid.

## 2013-01-12 NOTE — Assessment & Plan Note (Addendum)
has been on Alendronate for years-stopped recently

## 2013-01-12 NOTE — Assessment & Plan Note (Signed)
Supportive and palliative care.

## 2013-01-12 NOTE — Assessment & Plan Note (Signed)
Synbicort

## 2013-01-12 NOTE — Assessment & Plan Note (Addendum)
only takes Lexapro 5mg  started after his wife passed away about a year ago--denied depression--no change since Lexapro stopped.

## 2013-01-12 NOTE — Progress Notes (Signed)
Patient ID: Nathan Andrade., male   DOB: October 25, 1915, 79 y.o.   MRN: [id number] Code Status: DNR  Allergies  Allergen Reactions  . Sulfa Antibiotics     As noted on [facility]    Chief Complaint  Patient presents with  . Medical Managment of Chronic Issues    dementia, asthma, edema  . Annual Exam    new patient    HPI: Patient is a 79 y.o. male seen in the clinic at [facility] today for establishment as a new patient.             Depression: only takes Lexapro 5mg  started after his wife passed away about a year ago--denied depression.     Problem List Items Addressed This Visit     ICD-9-CM   Dementia      unspecified, without behavioral disturbance,  stopped Exelon and Namenda    Kidney stone     No pain on Norco 1/2 tid.     Melanoma of skin, site unspecified      hx of removal    Malignant neoplasm of prostate     Prostatic adenocarcinoma s/p radiation, presently taking Tamisulosin     Asthma, chronic     Synbicort    Glaucoma     Able to see my fingers in 2 feet. Eye drops.     Fibromatosis of plantar fascia     Chronic.     Osteoporosis, unspecified      has been on Alendronate for years-stopped recently    Depression     only takes Lexapro 5mg  started after his wife passed away about a year ago--denied depression--no change since Lexapro stopped.       GERD (gastroesophageal reflux disease)     Stable on Prilosec 20mg      FTT (failure to thrive) in adult     Supportive and palliative care.      Other Visit Diagnoses   Preventive measure    -  Primary    Relevant Orders       DNR (Do Not Resuscitate)       Review of Systems:  Review of Systems  Constitutional: Positive for weight loss and malaise/fatigue. Negative for fever, chills and diaphoresis.  HENT: Positive for hearing loss. Negative for ear pain, congestion, sore throat and neck pain.   Eyes: Negative for pain, discharge and redness.  Respiratory: Negative for cough,  sputum production, shortness of breath and wheezing.   Cardiovascular: Negative for chest pain, palpitations, orthopnea, claudication and leg swelling.  Gastrointestinal: Negative for heartburn, nausea, vomiting, abdominal pain, diarrhea, constipation and blood in stool.  Genitourinary: Positive for frequency. Negative for dysuria, urgency, hematuria and flank pain.  Musculoskeletal: Negative for myalgias, back pain and joint pain.  Skin: Negative for itching and rash.  Neurological: Negative for dizziness, tingling, tremors, sensory change, speech change, focal weakness, seizures, loss of consciousness, weakness and headaches.  Endo/Heme/Allergies: Negative for environmental allergies and polydipsia. Does not bruise/bleed easily.  Psychiatric/Behavioral: Positive for memory loss. Negative for depression and hallucinations. The patient is not nervous/anxious and does not have insomnia.      Past Medical History  Diagnosis Date  . Asthma   . Melanoma   . Prostatic adenocarcinoma   . Dementia, unspecified, without behavioral disturbance   . Glaucoma   . Fibromatosis, plantar   . Malignant neoplasm of prostate   . Malignant melanoma of skin of scalp and neck   . Disorder of bone and cartilage,  unspecified   . Personal history of other malignant neoplasm of skin   . Personal history of malignant melanoma of skin   . Glaucoma   . Diverticulosis   . Colon polyps   . Vitamin B 12 deficiency   . Vitamin D deficiency   . Insomnia   . Nephrolithiasis   . Urine incontinence   . History of TIA (transient ischemic attack)   . Eczema    Past Surgical History  Procedure Laterality Date  . Prostatectomy    . Inguinal hernia repair      right (mesh)  . Cataract extraction w/ intraocular lens  implant, bilateral    . Kidney stone surgery  09/2009   Social History:   reports that he quit smoking about 74 years ago. His smoking use included Pipe. He has never used smokeless tobacco. He  reports that  drinks alcohol. He reports that he does not use illicit drugs.  Family History  Problem Relation Age of Onset  . Heart disease Mother   . Emphysema Father     Medications: Patient's Medications  New Prescriptions   No medications on file  Previous Medications   ASPIRIN EC 81 MG TABLET    Take 81 mg by mouth daily.   BUDESONIDE-FORMOTEROL (SYMBICORT) 160-4.5 MCG/ACT INHALER    Inhale 2 puffs into the lungs daily. Wait 1 minute between puffs, rinse mouth after use   CALCIUM CITRATE-VITAMIN D (CITRACAL MAXIMUM) 315-250 MG-UNIT TABS    Take 2 tablets by mouth daily with breakfast.   CHOLECALCIFEROL (VITAMIN D) 1000 UNITS TABLET    Take 1,000 Units by mouth daily.   DOCUSATE SODIUM (COLACE) 100 MG CAPSULE    Take 200 mg by mouth daily.   FINASTERIDE (PROSCAR) 5 MG TABLET    Take 5 mg by mouth daily.   HYDROCODONE-ACETAMINOPHEN (NORCO/VICODIN) 5-325 MG PER TABLET    Take 1 tablet by mouth every 6 (six) hours as needed for pain.   LATANOPROST (XALATAN) 0.005 % OPHTHALMIC SOLUTION    Place 1 drop into both eyes at bedtime.   MULTIPLE VITAMINS-MINERALS (PRESERVISION AREDS 2 PO)    Take 1 capsule by mouth daily.   ONDANSETRON (ZOFRAN) 4 MG TABLET    Take 1 tablet (4 mg total) by mouth every 6 (six) hours.   OVER THE COUNTER MEDICATION    Take 2 oz by mouth daily at 6 PM. 1 oz of gin mix in with 1 capful of [place] every evening at 5pm   POLYETHYLENE GLYCOL (MIRALAX / GLYCOLAX) PACKET    Take 17 g by mouth daily.   SENNA-DOCUSATE (SENOKOT S) 8.6-50 MG PER TABLET    Take 1 tablet by mouth 2 (two) times daily.   TAMSULOSIN (FLOMAX) 0.4 MG CAPS    Take 0.4 mg by mouth daily.   VITAMIN B-12 (CYANOCOBALAMIN) 1000 MCG TABLET    Take 1,000 mcg by mouth daily.  Modified Medications   No medications on file  Discontinued Medications   ALENDRONATE (FOSAMAX) 70 MG TABLET    Take 70 mg by mouth every Saturday at 6 PM. Take with a full glass of water on an empty stomach. 30 minutes before meds  and food.   ESCITALOPRAM (LEXAPRO) 5 MG TABLET    Take 5 mg by mouth every morning.   MEMANTINE (NAMENDA) 10 MG TABLET    Take 10 mg by mouth 2 (two) times daily.   MULTIPLE VITAMINS-MINERALS (CENTRUM SILVER ADULT 50+ PO)    Take 1 tablet  by mouth daily.   RIVASTIGMINE (EXELON) 9.5 MG/24HR    Place 1 patch onto the skin daily. Rotate site 14 times before returning to original site     Physical Exam: Physical Exam  Constitutional: He is oriented to person, place, and time. He appears well-developed and well-nourished.  HENT:  Head: Normocephalic and atraumatic.  Eyes: Conjunctivae and EOM are normal. Pupils are equal, round, and reactive to light.  Neck: Normal range of motion. Neck supple. No JVD present. No thyromegaly present.  Cardiovascular: Normal rate, regular rhythm and normal heart sounds.   No murmur heard. Pulmonary/Chest: Effort normal. He has decreased breath sounds. He has no wheezes. He has no rales. He exhibits no tenderness.  Abdominal: Soft. Bowel sounds are normal. There is no tenderness.  Musculoskeletal: Normal range of motion. He exhibits no edema and no tenderness.  Lymphadenopathy:    He has no cervical adenopathy.  Neurological: He is alert and oriented to person, place, and time. He has normal reflexes. He displays normal reflexes. No cranial nerve deficit. He exhibits normal muscle tone. Coordination normal.  Skin: Skin is warm and dry. No rash noted. No erythema.  Psychiatric: His mood appears not anxious. His affect is not angry, not blunt, not labile and not inappropriate. His speech is not rapid and/or pressured, not delayed, not tangential and not slurred. He is slowed. He is not agitated, not aggressive, not hyperactive, not withdrawn, not actively hallucinating and not combative. Thought content is not paranoid and not delusional. Cognition and memory are impaired. He does not express impulsivity or inappropriate judgment. He does not exhibit a depressed mood.  He exhibits abnormal recent memory.    Filed Vitals:   01/11/13 1632  BP: 130/68  Pulse: 80  Height: 5\' 7"  (1.702 m)  Weight: 143 lb (64.864 kg)      Labs reviewed: Basic Metabolic Panel:  Recent Labs  09/81/19 2130 10/15/12 2345  NA 137  --   K 5.7* 4.5  CL 102  --   CO2 27  --   GLUCOSE 129*  --   BUN 21  --   CREATININE 1.07  --   CALCIUM 10.4  --    Liver Function Tests:  Recent Labs  10/15/12 2130  AST 32  ALT 12  ALKPHOS 59  BILITOT 0.4  PROT 6.7  ALBUMIN 3.4*    Recent Labs  10/15/12 2130  LIPASE 22   No results found for this basename: AMMONIA,  in the last 8760 hours CBC:  Recent Labs  10/15/12 2130  WBC 9.4  NEUTROABS 7.9*  HGB 13.5  HCT 38.4*  MCV 96.0  PLT 219   Lipid Panel: No results found for this basename: CHOL, HDL, LDLCALC, TRIG, CHOLHDL, LDLDIRECT,  in the last 8760 hours Anemia Panel: No results found for this basename: FOLATE, IRON, VITAMINB12,  in the last 8760 hours  Past Procedures:     Assessment/Plan Dementia  unspecified, without behavioral disturbance,  stopped Exelon and Namenda  Malignant neoplasm of prostate Prostatic adenocarcinoma s/p radiation, presently taking Tamisulosin   Melanoma of skin, site unspecified  hx of removal  Asthma, chronic Synbicort  Glaucoma Able to see my fingers in 2 feet. Eye drops.   Fibromatosis of plantar fascia Chronic.   Osteoporosis, unspecified  has been on Alendronate for years-stopped recently  Depression only takes Lexapro 5mg  started after his wife passed away about a year ago--denied depression--no change since Lexapro stopped.     Kidney stone No  pain on Norco 1/2 tid.   GERD (gastroesophageal reflux disease) Stable on Prilosec 20mg    FTT (failure to thrive) in adult Supportive and palliative care.     Family/ Staff Communication:   Goals of Care:  Labs/tests ordered:

## 2013-01-12 NOTE — Assessment & Plan Note (Signed)
hx of removal

## 2013-01-12 NOTE — Assessment & Plan Note (Addendum)
Able to see my fingers in 2 feet. Eye drops.

## 2013-01-12 NOTE — Assessment & Plan Note (Signed)
unspecified, without behavioral disturbance,  stopped Exelon and Namenda

## 2013-01-12 NOTE — Assessment & Plan Note (Signed)
Stable on Prilosec 20 mg

## 2013-01-12 NOTE — Assessment & Plan Note (Signed)
Prostatic adenocarcinoma s/p radiation, presently taking Tamisulosin

## 2013-01-19 ENCOUNTER — Other Ambulatory Visit: Payer: Self-pay | Admitting: *Deleted

## 2013-01-19 MED ORDER — HYDROCODONE-ACETAMINOPHEN 5-325 MG PO TABS: ORAL_TABLET | ORAL | Status: DC

## 2013-01-19 NOTE — Telephone Encounter (Signed)
Error

## 2013-03-27 LAB — HEPATIC FUNCTION PANEL
ALT: 8 U/L — AB (ref 10–40)
AST: 18 U/L (ref 14–40)
Bilirubin, Total: 0.7 mg/dL

## 2013-03-27 LAB — BASIC METABOLIC PANEL
Potassium: 3.9 mmol/L (ref 3.4–5.3)
Sodium: 140 mmol/L (ref 137–147)

## 2013-05-09 ENCOUNTER — Non-Acute Institutional Stay (SKILLED_NURSING_FACILITY): Payer: Medicare Other | Admitting: Nurse Practitioner

## 2013-05-09 DIAGNOSIS — K5900 Constipation, unspecified: Secondary | ICD-10-CM | POA: Insufficient documentation

## 2013-05-09 DIAGNOSIS — J45909 Unspecified asthma, uncomplicated: Secondary | ICD-10-CM

## 2013-05-09 DIAGNOSIS — F0390 Unspecified dementia without behavioral disturbance: Secondary | ICD-10-CM

## 2013-05-09 DIAGNOSIS — C61 Malignant neoplasm of prostate: Secondary | ICD-10-CM

## 2013-05-09 DIAGNOSIS — M722 Plantar fascial fibromatosis: Secondary | ICD-10-CM

## 2013-05-09 DIAGNOSIS — Z66 Do not resuscitate: Secondary | ICD-10-CM | POA: Insufficient documentation

## 2013-05-09 NOTE — Assessment & Plan Note (Signed)
Stable on Finasteride 5mg  daily and Tamsulosin 0.4mg  daily.

## 2013-05-09 NOTE — Assessment & Plan Note (Addendum)
unspecified, without behavioral disturbance,  stopped Exelon and Namenda, now admitted to Memory Care Unit for care, Ativan 1mg q4hr prn for agitation.        

## 2013-05-09 NOTE — Assessment & Plan Note (Signed)
Chronic. Takes Hydrocodone/APAP 5/325 1/2 tablet tid.

## 2013-05-09 NOTE — Assessment & Plan Note (Signed)
Synbicort 2 puffs daily.

## 2013-05-09 NOTE — Progress Notes (Signed)
Patient ID: Nathan Santee., male   DOB: Mar 13, 1916, 79 y.o.   MRN: [id number] Code Status: DNR  Allergies  Allergen Reactions  . Sulfa Antibiotics     As noted on [facility]    Chief Complaint  Patient presents with  . Medical Managment of Chronic Issues    HPI: Patient is a 79 y.o. male seen in the SNF at [facility] today for evaluation of his chronic medical conditions. He was admitted to Memory Care Unit 04/27/13 due to his progression of dementia. He wandered off unit when he resided in [facility]. He fell multiple times related to his frailty and lack of safety awareness. The most recent fall was the day he admitted to Memory Care unit and hit his right arm--negative X-ray of the right shoulder/humerus/elbow--still c/o some discomfort with ROM of the right arm today.  Problem List Items Addressed This Visit   Asthma, chronic     Synbicort 2 puffs daily.       Dementia      unspecified, without behavioral disturbance,  stopped Exelon and Namenda, now admitted to Memory Care Unit for care, Ativan 1mg  q4hr prn for agitation.       DNR (do not resuscitate) - Primary   Fibromatosis of plantar fascia     Chronic. Takes Hydrocodone/APAP 5/325 1/2 tablet tid.       Malignant neoplasm of prostate     Stable on Finasteride 5mg  daily and Tamsulosin 0.4mg  daily.     Unspecified constipation     Stable on Docusate 200mg  po daily and Senokot S I bid and Polyethylene daily prn.        Review of Systems:  Review of Systems  Constitutional: Positive for weight loss and malaise/fatigue. Negative for fever, chills and diaphoresis.  HENT: Positive for hearing loss. Negative for ear pain, congestion, sore throat and neck pain.   Eyes: Negative for pain, discharge and redness.  Respiratory: Negative for cough, sputum production, shortness of breath and wheezing.   Cardiovascular: Negative for chest pain, palpitations, orthopnea, claudication and leg swelling.  Gastrointestinal:  Negative for heartburn, nausea, vomiting, abdominal pain, diarrhea, constipation and blood in stool.  Genitourinary: Positive for frequency. Negative for dysuria, urgency, hematuria and flank pain.  Musculoskeletal: Positive for joint pain (right shoulder) and falls. Negative for myalgias and back pain.  Skin: Negative for itching and rash.  Neurological: Negative for dizziness, tingling, tremors, sensory change, speech change, focal weakness, seizures, loss of consciousness, weakness and headaches.  Endo/Heme/Allergies: Negative for environmental allergies and polydipsia. Does not bruise/bleed easily.  Psychiatric/Behavioral: Positive for memory loss. Negative for depression and hallucinations. The patient is not nervous/anxious and does not have insomnia.      Past Medical History  Diagnosis Date  . Asthma   . Melanoma   . Prostatic adenocarcinoma   . Dementia, unspecified, without behavioral disturbance   . Glaucoma   . Fibromatosis, plantar   . Malignant neoplasm of prostate   . Malignant melanoma of skin of scalp and neck   . Disorder of bone and cartilage, unspecified   . Personal history of other malignant neoplasm of skin   . Personal history of malignant melanoma of skin   . Glaucoma   . Diverticulosis   . Colon polyps   . Vitamin B 12 deficiency   . Vitamin D deficiency   . Insomnia   . Nephrolithiasis   . Urine incontinence   . History of TIA (transient ischemic attack)   .  Eczema    Past Surgical History  Procedure Laterality Date  . Prostatectomy    . Inguinal hernia repair      right (mesh)  . Cataract extraction w/ intraocular lens  implant, bilateral    . Kidney stone surgery  09/2009   Social History:   reports that he quit smoking about 74 years ago. His smoking use included Pipe. He has never used smokeless tobacco. He reports that  drinks alcohol. He reports that he does not use illicit drugs.  Family History  Problem Relation Age of Onset  . Heart  disease Mother   . Emphysema Father     Medications: Patient's Medications  New Prescriptions   No medications on file  Previous Medications   ASPIRIN EC 81 MG TABLET    Take 81 mg by mouth daily.   BUDESONIDE-FORMOTEROL (SYMBICORT) 160-4.5 MCG/ACT INHALER    Inhale 2 puffs into the lungs daily. Wait 1 minute between puffs, rinse mouth after use   CALCIUM CITRATE-VITAMIN D (CITRACAL MAXIMUM) 315-250 MG-UNIT TABS    Take 2 tablets by mouth daily with breakfast.   CHOLECALCIFEROL (VITAMIN D) 1000 UNITS TABLET    Take 1,000 Units by mouth daily.   DOCUSATE SODIUM (COLACE) 100 MG CAPSULE    Take 200 mg by mouth daily.   FINASTERIDE (PROSCAR) 5 MG TABLET    Take 5 mg by mouth daily.   HYDROCODONE-ACETAMINOPHEN (NORCO/VICODIN) 5-325 MG PER TABLET    Take 1/2 tablet three times a day   LATANOPROST (XALATAN) 0.005 % OPHTHALMIC SOLUTION    Place 1 drop into both eyes at bedtime.   MULTIPLE VITAMINS-MINERALS (PRESERVISION AREDS 2 PO)    Take 1 capsule by mouth daily.   ONDANSETRON (ZOFRAN) 4 MG TABLET    Take 1 tablet (4 mg total) by mouth every 6 (six) hours.   OVER THE COUNTER MEDICATION    Take 2 oz by mouth daily at 6 PM. 1 oz of gin mix in with 1 capful of [place] every evening at 5pm   POLYETHYLENE GLYCOL (MIRALAX / GLYCOLAX) PACKET    Take 17 g by mouth daily.   SENNA-DOCUSATE (SENOKOT S) 8.6-50 MG PER TABLET    Take 1 tablet by mouth 2 (two) times daily.   TAMSULOSIN (FLOMAX) 0.4 MG CAPS    Take 0.4 mg by mouth daily.   VITAMIN B-12 (CYANOCOBALAMIN) 1000 MCG TABLET    Take 1,000 mcg by mouth daily.  Modified Medications   No medications on file  Discontinued Medications   No medications on file     Physical Exam: Physical Exam  Constitutional: He is oriented to person, place, and time. He appears well-developed and well-nourished.  HENT:  Head: Normocephalic and atraumatic.  Eyes: Conjunctivae and EOM are normal. Pupils are equal, round, and reactive to light.  Neck: Normal range  of motion. Neck supple. No JVD present. No thyromegaly present.  Cardiovascular: Normal rate, regular rhythm and normal heart sounds.   No murmur heard. Pulmonary/Chest: Effort normal. He has decreased breath sounds. He has no wheezes. He has no rales. He exhibits no tenderness.  Abdominal: Soft. Bowel sounds are normal. There is no tenderness.  Musculoskeletal: Normal range of motion. He exhibits tenderness (right shoulder but no decreased ROM). He exhibits no edema.  Lymphadenopathy:    He has no cervical adenopathy.  Neurological: He is alert and oriented to person, place, and time. He has normal reflexes. No cranial nerve deficit. He exhibits normal muscle tone. Coordination normal.  Skin: Skin is warm and dry. No rash noted. No erythema.  Psychiatric: His mood appears not anxious. His affect is not angry, not blunt, not labile and not inappropriate. His speech is not rapid and/or pressured, not delayed, not tangential and not slurred. He is slowed. He is not agitated, not aggressive, not hyperactive, not withdrawn, not actively hallucinating and not combative. Thought content is not paranoid and not delusional. Cognition and memory are impaired. He does not express impulsivity or inappropriate judgment. He does not exhibit a depressed mood. He exhibits abnormal recent memory.    Filed Vitals:   05/09/13 1651  BP: 114/71  Pulse: 58  Temp: 98 F (36.7 C)  TempSrc: Tympanic  Resp: 20      Labs reviewed: Basic Metabolic Panel:  Recent Labs  62/95/28 2130 10/15/12 2345 12/19/12 03/27/13  NA 137  --   --  140  K 5.7* 4.5  --  3.9  CL 102  --   --   --   CO2 27  --   --   --   GLUCOSE 129*  --   --   --   BUN 21  --   --  18  CREATININE 1.07  --   --  1.1  CALCIUM 10.4  --   --   --   TSH  --   --  1.70  --    Liver Function Tests:  Recent Labs  10/15/12 2130 03/27/13  AST 32 18  ALT 12 8*  ALKPHOS 59 51  BILITOT 0.4  --   PROT 6.7  --   ALBUMIN 3.4*  --      Recent Labs  10/15/12 2130  LIPASE 22   CBC:  Recent Labs  10/15/12 2130 12/19/12  WBC 9.4 6.8  NEUTROABS 7.9*  --   HGB 13.5 12.2*  HCT 38.4* 36*  MCV 96.0  --   PLT 219 217   Past Procedures: 04/27/13 X-ray R humerus, shoulder, elbow: soft tissue swelling olecranon area, no definite fractures or dislocations can be identified. No acute bony abnormalities right humerus and right shoulder can be identified.     Assessment/Plan Unspecified constipation Stable on Docusate 200mg  po daily and Senokot S I bid and Polyethylene daily prn.   Malignant neoplasm of prostate Stable on Finasteride 5mg  daily and Tamsulosin 0.4mg  daily.   Dementia  unspecified, without behavioral disturbance,  stopped Exelon and Namenda, now admitted to [facility] for care, Ativan 1mg  q4hr prn for agitation.     Asthma, chronic Synbicort 2 puffs daily.     Fibromatosis of plantar fascia Chronic. Takes Hydrocodone/APAP 5/325 1/2 tablet tid.       Family/ Staff Communication: observe the patient  Goals of Care: SNF  Labs/tests ordered: none

## 2013-05-09 NOTE — Assessment & Plan Note (Addendum)
Stable on Docusate 200mg po daily and Senokot S I bid and Polyethylene daily prn.    

## 2013-05-11 ENCOUNTER — Encounter: Payer: Self-pay | Admitting: Internal Medicine

## 2013-05-11 ENCOUNTER — Non-Acute Institutional Stay (SKILLED_NURSING_FACILITY): Payer: Medicare Other | Admitting: Internal Medicine

## 2013-05-11 DIAGNOSIS — W19XXXS Unspecified fall, sequela: Secondary | ICD-10-CM

## 2013-05-11 DIAGNOSIS — R627 Adult failure to thrive: Secondary | ICD-10-CM

## 2013-05-11 DIAGNOSIS — F0390 Unspecified dementia without behavioral disturbance: Secondary | ICD-10-CM

## 2013-05-11 NOTE — Progress Notes (Signed)
Date: 05/11/2013  MRN:  [id number] Name:  Nathan Andrade. Sex:  male Age:  79 y.o. DOB:[birth year]                        Facility/Room; [facility] [address] Level Of Care:SNF Provider: Murray Hodgkins, MD  Emergency Contacts: Contact Information   Name Relation Home Work Mobile   Hilko,Claudia Daughter [phone]  [phone]      Code Status:DNR  Allergies: Allergies  Allergen Reactions  . Sulfa Antibiotics     As noted on [facility]     Chief Complaint  Patient presents with  . Medical Managment of Chronic Issues    New Patient comphrensive exam      HPI: New admit to Memory Care unit at [facility] on 04/27/13.He was admitted to Memory Care Unit 04/27/13 due to his progression of dementia. He wandered off unit when he resided in [facility]. He fell multiple times related to his frailty and lack of safety awareness. The most recent fall was the day he admitted to Memory Care unit and hit his right arm--negative X-ray of the right shoulder/humerus/elbow--still c/o some discomfort with ROM of the right arm  He has stayed 2 weeks in the Memory Unit and is adapting.  He says he is comfortable.  Past Medical History  Diagnosis Date  . Asthma   . Melanoma   . Prostatic adenocarcinoma   . Dementia, unspecified, without behavioral disturbance   . Glaucoma   . Fibromatosis, plantar   . Malignant neoplasm of prostate   . Malignant melanoma of skin of scalp and neck   . Disorder of bone and cartilage, unspecified   . Personal history of other malignant neoplasm of skin   . Personal history of malignant melanoma of skin   . Glaucoma   . Diverticulosis   . Colon polyps   . Vitamin B 12 deficiency   . Vitamin D deficiency   . Insomnia   . Nephrolithiasis   . Urine incontinence   . History of TIA (transient ischemic attack)   . Eczema     Past Surgical History  Procedure Laterality Date  . Prostatectomy    . Inguinal hernia repair      right (mesh)  . Cataract extraction  w/ intraocular lens  implant, bilateral Bilateral   . Kidney stone surgery  09/2009      Current Outpatient Prescriptions  Medication Sig Dispense Refill  . aspirin EC 81 MG tablet Take 81 mg by mouth daily.      . budesonide-formoterol (SYMBICORT) 160-4.5 MCG/ACT inhaler Inhale 2 puffs into the lungs daily. Wait 1 minute between puffs, rinse mouth after use      . Calcium Citrate-Vitamin D (CITRACAL MAXIMUM) 315-250 MG-UNIT TABS Take 2 tablets by mouth daily with breakfast.      . cholecalciferol (VITAMIN D) 1000 UNITS tablet Take 1,000 Units by mouth daily.      Marland Kitchen docusate sodium (COLACE) 100 MG capsule Take 200 mg by mouth daily.      . finasteride (PROSCAR) 5 MG tablet Take 5 mg by mouth daily.      Marland Kitchen HYDROcodone-acetaminophen (NORCO/VICODIN) 5-325 MG per tablet Take 1/2 tablet three times a day  45 tablet  0  . latanoprost (XALATAN) 0.005 % ophthalmic solution Place 1 drop into both eyes at bedtime.      . Multiple Vitamins-Minerals (PRESERVISION AREDS 2 PO) Take 1 capsule by mouth daily.      Marland Kitchen  ondansetron (ZOFRAN) 4 MG tablet Take 1 tablet (4 mg total) by mouth every 6 (six) hours.  12 tablet  0  . OVER THE COUNTER MEDICATION Take 2 oz by mouth daily at 6 PM. 1 oz of gin mix in with 1 capful of vermouth every evening at 5pm      . polyethylene glycol (MIRALAX / GLYCOLAX) packet Take 17 g by mouth daily.      Marland Kitchen senna-docusate (SENOKOT S) 8.6-50 MG per tablet Take 1 tablet by mouth 2 (two) times daily.      . tamsulosin (FLOMAX) 0.4 MG CAPS Take 0.4 mg by mouth daily.      . vitamin B-12 (CYANOCOBALAMIN) 1000 MCG tablet Take 1,000 mcg by mouth daily.       No current facility-administered medications for this visit.    Immunization History  Administered Date(s) Administered  . Influenza Whole 07/26/2012  . Pneumococcal Polysaccharide 09/20/2009  . Td 09/20/2005  . Zoster 01/30/2008     Diet: unrestricted  History  Substance Use Topics  . Smoking status: Former Smoker     Types: Pipe    Quit date: 01/12/1939  . Smokeless tobacco: Never Used  . Alcohol Use: Yes     Comment: 1 oz Gin mixed with [place] every evening as needed    Family History  Problem Relation Age of Onset  . Heart disease Mother   . Emphysema Father    Family Status  Relation Status Death Age  . Mother Deceased 62  . Father Deceased 38  . Daughter Alive   . Son Alive   . Daughter Alive       Review of Systems  Constitutional: Negative.   HENT: Negative.  Negative for ear pain and dental problem.   Eyes: Negative.   Respiratory: Negative.   Cardiovascular: Negative.   Gastrointestinal: Negative.   Genitourinary: Positive for frequency.  Musculoskeletal:       Tender right arm since his fall.  Allergic/Immunologic: Negative.   Neurological:       Significant dementia.  Hematological: Negative.   Psychiatric/Behavioral: Positive for confusion.     Vital signs: BP 130/70  Pulse 70  Temp(Src) 98.6 F (37 C) (Oral)  Resp 18  Ht 5\' 7"  (1.702 m)  Wt 137 lb 12.8 oz (62.506 kg)  BMI 21.58 kg/m2  Physical Exam  Constitutional: He is well-developed, well-nourished, and in no distress. No distress.  HENT:  Head: Normocephalic and atraumatic.  Right Ear: External ear normal.  Left Ear: External ear normal.  Nose: Nose normal.  Mouth/Throat: No oropharyngeal exudate.  Eyes: Conjunctivae and EOM are normal. Pupils are equal, round, and reactive to light.  Corrective lenses.  Neck: No JVD present. No tracheal deviation present. No thyromegaly present.  Cardiovascular: Normal rate, regular rhythm, normal heart sounds and intact distal pulses.  Exam reveals no gallop and no friction rub.   No murmur heard. Pulmonary/Chest: Breath sounds normal. No respiratory distress. He has no wheezes. He has no rales. He exhibits no tenderness.  Abdominal: He exhibits no distension and no mass. There is no tenderness.  Musculoskeletal: Normal range of motion. He exhibits edema. He  exhibits no tenderness.  Lymphadenopathy:    He has no cervical adenopathy.  Neurological: He is alert. No cranial nerve deficit.  Unstable gait.  Skin: No rash noted. No erythema. No pallor.  Psychiatric: Mood and affect normal.     Screening Score  MMS    PHQ2    PHQ9  Fall Risk    BIMS    Annual summary: Hospitalizations: none in the last year Infection History: none of significance Functional assessment: wanders. Independent in feeding, toileting, dressing and walking. Requires medication supervision.  Areas of potential improvement: Safe ambulation Rehabilitation Potential: poor Prognosis for survival: fair  Plan: Dementia: remain in memory care.  Fall, sequela: mild tenderness of the right shoulder should resolve over time  FTT (failure to thrive) in adult; manage diet. Stimulation in Memory Care activities may help. If weigh loss persists, consider mirtazapine.

## 2013-05-23 ENCOUNTER — Non-Acute Institutional Stay (SKILLED_NURSING_FACILITY): Payer: Medicare Other | Admitting: Nurse Practitioner

## 2013-05-23 ENCOUNTER — Encounter: Payer: Self-pay | Admitting: Nurse Practitioner

## 2013-05-23 DIAGNOSIS — M810 Age-related osteoporosis without current pathological fracture: Secondary | ICD-10-CM

## 2013-05-23 DIAGNOSIS — K5900 Constipation, unspecified: Secondary | ICD-10-CM

## 2013-05-23 DIAGNOSIS — R350 Frequency of micturition: Secondary | ICD-10-CM

## 2013-05-23 DIAGNOSIS — K219 Gastro-esophageal reflux disease without esophagitis: Secondary | ICD-10-CM

## 2013-05-23 DIAGNOSIS — F0390 Unspecified dementia without behavioral disturbance: Secondary | ICD-10-CM

## 2013-05-23 DIAGNOSIS — M1990 Unspecified osteoarthritis, unspecified site: Secondary | ICD-10-CM

## 2013-05-23 DIAGNOSIS — F329 Major depressive disorder, single episode, unspecified: Secondary | ICD-10-CM

## 2013-05-23 DIAGNOSIS — J45909 Unspecified asthma, uncomplicated: Secondary | ICD-10-CM

## 2013-05-23 NOTE — Assessment & Plan Note (Signed)
unspecified, without behavioral disturbance,  stopped Exelon and Namenda, now admitted to Memory Care Unit for care, Ativan 1mg q4hr prn for agitation.        

## 2013-05-23 NOTE — Assessment & Plan Note (Signed)
only takes Lexapro 5mg  started after his wife passed away about a year ago--off when [place] was in [place] since he had no c/o depressive mood. Relapsed since the patient admitted to Memory Care Unit-difficulty adjusting, outburst, angry, agitated at times--will start Celexa 10mg  daily and observe the patient.

## 2013-05-23 NOTE — Assessment & Plan Note (Signed)
Takes Tamisulosin and Finasteride

## 2013-05-23 NOTE — Assessment & Plan Note (Signed)
Stable on Docusate 200mg  po daily and Senokot S I bid and Polyethylene daily prn.

## 2013-05-23 NOTE — Progress Notes (Signed)
Patient ID: Nathan Andrade., male   DOB: [birth year], 79 yro Code Status: DNR  Allergies  Allergen Reactions  . Sulfa Antibiotics     As noted on [facility]    Chief Complaint  Patient presents with  . Medical Managment of Chronic Issues    anxiety/depression.     HPI: Patient is a 79 y.o. male seen in the SNF at [facility] today for evaluation of  Depression/anxiety and other chronic medical conditions.  Problem List Items Addressed This Visit   Asthma, chronic     Stable on daily Synbicort      Dementia      unspecified, without behavioral disturbance,  stopped Exelon and Namenda, now admitted to Memory Care Unit for care, Ativan 1mg  q4hr prn for agitation.         Depression     only takes Lexapro 5mg  started after his wife passed away about a year ago--off when he was in [place] since he had no c/o depressive mood. Relapsed since the patient admitted to Memory Care Unit-difficulty adjusting, outburst, angry, agitated at times--will start Celexa 10mg  daily and observe the patient.         GERD (gastroesophageal reflux disease)     Stable, not on PPI, takes Zofran prn.     Osteoarthritis     Multiple sites, foot, shoulder, and legs--takes Norco 1/2 bid     Relevant Medications      HYDROcodone-acetaminophen (NORCO/VICODIN) 5-325 MG per tablet   Osteoporosis, unspecified     Takes Vit D and Ca    Unspecified constipation - Primary     Stable on Docusate 200mg  po daily and Senokot S I bid and Polyethylene daily prn.       Urinary frequency     Takes Tamisulosin and Finasteride       Past Medical History  Diagnosis Date  . Asthma   . Melanoma   . Prostatic adenocarcinoma   . Dementia, unspecified, without behavioral disturbance   . Glaucoma   . Fibromatosis, plantar   . Malignant neoplasm of prostate   . Malignant melanoma of skin of scalp and neck   . Disorder of bone and cartilage, unspecified   . Personal history of other malignant neoplasm of  skin   . Personal history of malignant melanoma of skin   . Glaucoma   . Diverticulosis   . Colon polyps   . Vitamin B 12 deficiency   . Vitamin D deficiency   . Insomnia   . Nephrolithiasis   . Urine incontinence   . History of TIA (transient ischemic attack)   . Eczema    Past Surgical History  Procedure Laterality Date  . Prostatectomy    . Inguinal hernia repair      right (mesh)  . Cataract extraction w/ intraocular lens  implant, bilateral Bilateral   . Kidney stone surgery  09/2009   Social History:   reports that he quit smoking about 74 years ago. His smoking use included Pipe. He has never used smokeless tobacco. He reports that  drinks alcohol. He reports that he does not use illicit drugs.  Family History  Problem Relation Age of Onset  . Heart disease Mother   . Emphysema Father     Medications: Patient's Medications  New Prescriptions   No medications on file  Previous Medications   ASPIRIN EC 81 MG TABLET    Take 81 mg by mouth daily.   BUDESONIDE-FORMOTEROL (SYMBICORT) 160-4.5 MCG/ACT INHALER  Inhale 2 puffs into the lungs daily. Wait 1 minute between puffs, rinse mouth after use   CALCIUM CITRATE-VITAMIN D (CITRACAL MAXIMUM) 315-250 MG-UNIT TABS    Take 2 tablets by mouth daily with breakfast.   CHOLECALCIFEROL (VITAMIN D) 1000 UNITS TABLET    Take 1,000 Units by mouth daily.   DOCUSATE SODIUM (COLACE) 100 MG CAPSULE    Take 200 mg by mouth daily.   FINASTERIDE (PROSCAR) 5 MG TABLET    Take 5 mg by mouth daily.   LATANOPROST (XALATAN) 0.005 % OPHTHALMIC SOLUTION    Place 1 drop into both eyes at bedtime.   LORATADINE (CLARITIN) 10 MG TABLET    Take 10 mg by mouth daily.   MULTIPLE VITAMINS-MINERALS (PRESERVISION AREDS 2 PO)    Take 1 capsule by mouth daily.   ONDANSETRON (ZOFRAN) 4 MG TABLET    Take 1 tablet (4 mg total) by mouth every 6 (six) hours.   OVER THE COUNTER MEDICATION    Take 2 oz by mouth daily at 6 PM. 1 oz of gin mix in with 1 capful of  [place]mouth every evening at 5pm   POLYETHYLENE GLYCOL (MIRALAX / GLYCOLAX) PACKET    Take 17 g by mouth daily as needed.    SENNA-DOCUSATE (SENOKOT S) 8.6-50 MG PER TABLET    Take 1 tablet by mouth 2 (two) times daily.   TAMSULOSIN (FLOMAX) 0.4 MG CAPS    Take 0.4 mg by mouth daily.   VITAMIN B-12 (CYANOCOBALAMIN) 1000 MCG TABLET    Take 1,000 mcg by mouth daily.  Modified Medications   Modified Medication Previous Medication   HYDROCODONE-ACETAMINOPHEN (NORCO/VICODIN) 5-325 MG PER TABLET HYDROcodone-acetaminophen (NORCO/VICODIN) 5-325 MG per tablet      every 6 (six) hours as needed. Take 1/2 tablet three times a day    Take 1/2 tablet three times a day  Discontinued Medications   No medications on file   Filed Vitals:   05/23/13 1423  BP: 120/82  Pulse: 80  Temp: 97 F (36.1 C)  TempSrc: Tympanic  Resp: 20    Labs reviewed: Basic Metabolic Panel:  Recent Labs  09/21/70 2130 10/15/12 2345 12/19/12 03/27/13  NA 137  --   --  140  K 5.7* 4.5  --  3.9  CL 102  --   --   --   CO2 27  --   --   --   GLUCOSE 129*  --   --   --   BUN 21  --   --  18  CREATININE 1.07  --   --  1.1  CALCIUM 10.4  --   --   --   TSH  --   --  1.70  --    Liver Function Tests:  Recent Labs  10/15/12 2130 03/27/13  AST 32 18  ALT 12 8*  ALKPHOS 59 51  BILITOT 0.4  --   PROT 6.7  --   ALBUMIN 3.4*  --    CBC:  Recent Labs  10/15/12 2130 12/19/12  WBC 9.4 6.8  NEUTROABS 7.9*  --   HGB 13.5 12.2*  HCT 38.4* 36*  MCV 96.0  --   PLT 219 217   Past Procedures: 04/27/13 X-ray R shoulder, humerus, elbow: no definite fractures or dislocation can be identified.   Assessment/Plan Unspecified constipation Stable on Docusate 200mg  po daily and Senokot S I bid and Polyethylene daily prn.     Osteoporosis, unspecified Takes Vit D and Ca  GERD (gastroesophageal reflux disease) Stable, not on PPI, takes Zofran prn.   Depression only takes Lexapro 5mg  started after his wife passed away  about a year ago--off when he was in [place] since he had no c/o depressive mood. Relapsed since the patient admitted to Memory Care Unit-difficulty adjusting, outburst, angry, agitated at times--will start Celexa 10mg  daily and observe the patient.       Dementia  unspecified, without behavioral disturbance,  stopped Exelon and Namenda, now admitted to Memory Care Unit for care, Ativan 1mg  q4hr prn for agitation.       Asthma, chronic Stable on daily Synbicort    Urinary frequency Takes Tamisulosin and Finasteride   Osteoarthritis Multiple sites, foot, shoulder, and legs--takes Norco 1/2 bid     Family/ Staff Communication: observe the patient  Goals of Care: SNF  Labs/tests ordered: none     Review of Systems  Constitutional: Negative.   HENT: Negative.  Negative for ear pain and dental problem.   Eyes: Negative.   Respiratory: Negative.   Cardiovascular: Negative.   Gastrointestinal: Negative.   Genitourinary: Positive for frequency.  Musculoskeletal:       Tender right arm since his fall.  Allergic/Immunologic: Negative.   Neurological:       Significant dementia.  Hematological: Negative.   Psychiatric/Behavioral: Positive for confusion and agitation. The patient is nervous/anxious.     Physical Exam  Constitutional: He is well-developed, well-nourished, and in no distress. No distress.  HENT:  Head: Normocephalic and atraumatic.  Right Ear: External ear normal.  Left Ear: External ear normal.  Nose: Nose normal.  Mouth/Throat: No oropharyngeal exudate.  Eyes: Conjunctivae and EOM are normal. Pupils are equal, round, and reactive to light.  Corrective lenses.  Neck: No JVD present. No tracheal deviation present. No thyromegaly present.  Cardiovascular: Normal rate, regular rhythm, normal heart sounds and intact distal pulses.  Exam reveals no gallop and no friction rub.   No murmur heard. Pulmonary/Chest: Breath sounds normal. No respiratory distress.  He has no wheezes. He has no rales. He exhibits no tenderness.  Abdominal: He exhibits no distension and no mass. There is no tenderness.  Musculoskeletal: Normal range of motion. He exhibits edema. He exhibits no tenderness.  Lymphadenopathy:    He has no cervical adenopathy.  Neurological: He is alert. No cranial nerve deficit.  Unstable gait.  Skin: No rash noted. No erythema. No pallor.  Psychiatric: His mood appears anxious. He is agitated. He expresses impulsivity. He exhibits a depressed mood. He is not concerned with wish fulfillment. He exhibits abnormal new learning ability, abnormal recent memory and abnormal remote memory.

## 2013-05-23 NOTE — Assessment & Plan Note (Signed)
Stable, not on PPI, takes Zofran prn.    

## 2013-05-23 NOTE — Assessment & Plan Note (Signed)
Stable on daily Synbicort   

## 2013-05-23 NOTE — Assessment & Plan Note (Signed)
Takes Vit D and Ca

## 2013-05-23 NOTE — Assessment & Plan Note (Signed)
Multiple sites, foot, shoulder, and legs--takes Norco 1/2 bid

## 2013-05-24 ENCOUNTER — Encounter: Payer: Self-pay | Admitting: Nurse Practitioner

## 2013-05-24 ENCOUNTER — Non-Acute Institutional Stay (SKILLED_NURSING_FACILITY): Payer: Medicare Other | Admitting: Nurse Practitioner

## 2013-05-24 DIAGNOSIS — R350 Frequency of micturition: Secondary | ICD-10-CM

## 2013-05-24 DIAGNOSIS — W19XXXA Unspecified fall, initial encounter: Secondary | ICD-10-CM

## 2013-05-24 DIAGNOSIS — F0390 Unspecified dementia without behavioral disturbance: Secondary | ICD-10-CM

## 2013-05-24 DIAGNOSIS — M25511 Pain in right shoulder: Secondary | ICD-10-CM

## 2013-05-24 DIAGNOSIS — F329 Major depressive disorder, single episode, unspecified: Secondary | ICD-10-CM

## 2013-05-24 DIAGNOSIS — M25519 Pain in unspecified shoulder: Secondary | ICD-10-CM

## 2013-05-24 NOTE — Assessment & Plan Note (Signed)
Takes Tamisulosin and Finasteride  

## 2013-05-24 NOTE — Progress Notes (Signed)
Patient ID: Nathan Andrade., male   DOB: 30-Jan-1916, 79 y.o.   MRN: [id number] Code Status: DNR  Allergies  Allergen Reactions  . Sulfa Antibiotics     As noted on [facility]    Chief Complaint  Patient presents with  . Medical Managment of Chronic Issues    pain and limited ROM of the right shoulder, s/p fall.     HPI: Patient is a 79 y.o. male seen in the SNF at [facility] today for evaluation of  S/p fall, the right shoulder pain with limited ROM, Depression/anxiety and other chronic medical conditions.  Problem List Items Addressed This Visit   Dementia      unspecified, without behavioral disturbance,  stopped Exelon and Namenda, now admitted to Memory Care Unit for care, Ativan 1mg  q4hr prn for agitation.           Relevant Medications      citalopram (CELEXA) 10 MG tablet   Depression     only takes Lexapro 5mg  started after his wife passed away about a year ago--off when he was in [place] since he had no c/o depressive mood. Relapsed since the patient admitted to Memory Care Unit-difficulty adjusting, outburst, angry, agitated at times--05/23/13 started Celexa 10mg  daily and observe the patient.           Relevant Medications      citalopram (CELEXA) 10 MG tablet   Fall     Lack of safety awareness and increased frailty contribute to his falling--intensive supervision needed. Will obtain CBC, CMP, UA C/S in am.     Pain of right shoulder region - Primary     Sustained the right shoulder pain from the fall this am in his room, very limited ROM with pain, unable to make a tight fist, X-ray of the right humerus, shoulder, and scapula pending. The patient was instructed to immobilize the right shoulder for now.     Urinary frequency     Takes Tamisulosin and Finasteride         Past Medical History  Diagnosis Date  . Asthma   . Melanoma   . Prostatic adenocarcinoma   . Dementia, unspecified, without behavioral disturbance   . Glaucoma   . Fibromatosis,  plantar   . Malignant neoplasm of prostate   . Malignant melanoma of skin of scalp and neck   . Disorder of bone and cartilage, unspecified   . Personal history of other malignant neoplasm of skin   . Personal history of malignant melanoma of skin   . Glaucoma   . Diverticulosis   . Colon polyps   . Vitamin B 12 deficiency   . Vitamin D deficiency   . Insomnia   . Nephrolithiasis   . Urine incontinence   . History of TIA (transient ischemic attack)   . Eczema    Past Surgical History  Procedure Laterality Date  . Prostatectomy    . Inguinal hernia repair      right (mesh)  . Cataract extraction w/ intraocular lens  implant, bilateral Bilateral   . Kidney stone surgery  09/2009   Social History:   reports that he quit smoking about 74 years ago. His smoking use included Pipe. He has never used smokeless tobacco. He reports that  drinks alcohol. He reports that he does not use illicit drugs.  Family History  Problem Relation Age of Onset  . Heart disease Mother   . Emphysema Father     Medications: Patient's Medications  New Prescriptions   No medications on file  Previous Medications   ASPIRIN EC 81 MG TABLET    Take 81 mg by mouth daily.   BUDESONIDE-FORMOTEROL (SYMBICORT) 160-4.5 MCG/ACT INHALER    Inhale 2 puffs into the lungs daily. Wait 1 minute between puffs, rinse mouth after use   CALCIUM CITRATE-VITAMIN D (CITRACAL MAXIMUM) 315-250 MG-UNIT TABS    Take 2 tablets by mouth daily with breakfast.   CHOLECALCIFEROL (VITAMIN D) 1000 UNITS TABLET    Take 1,000 Units by mouth daily.   CITALOPRAM (CELEXA) 10 MG TABLET    Take 10 mg by mouth daily.   DOCUSATE SODIUM (COLACE) 100 MG CAPSULE    Take 200 mg by mouth daily.   FINASTERIDE (PROSCAR) 5 MG TABLET    Take 5 mg by mouth daily.   HYDROCODONE-ACETAMINOPHEN (NORCO/VICODIN) 5-325 MG PER TABLET    every 6 (six) hours as needed. Take 1/2 tablet three times a day   LATANOPROST (XALATAN) 0.005 % OPHTHALMIC SOLUTION    Place  1 drop into both eyes at bedtime.   LORATADINE (CLARITIN) 10 MG TABLET    Take 10 mg by mouth daily.   MULTIPLE VITAMINS-MINERALS (PRESERVISION AREDS 2 PO)    Take 1 capsule by mouth daily.   ONDANSETRON (ZOFRAN) 4 MG TABLET    Take 1 tablet (4 mg total) by mouth every 6 (six) hours.   OVER THE COUNTER MEDICATION    Take 2 oz by mouth daily at 6 PM. 1 oz of gin mix in with 1 capful of [place] every evening at 5pm   POLYETHYLENE GLYCOL (MIRALAX / GLYCOLAX) PACKET    Take 17 g by mouth daily as needed.    SENNA-DOCUSATE (SENOKOT S) 8.6-50 MG PER TABLET    Take 1 tablet by mouth 2 (two) times daily.   TAMSULOSIN (FLOMAX) 0.4 MG CAPS    Take 0.4 mg by mouth daily.   VITAMIN B-12 (CYANOCOBALAMIN) 1000 MCG TABLET    Take 1,000 mcg by mouth daily.  Modified Medications   No medications on file  Discontinued Medications   No medications on file   Filed Vitals:   05/24/13 1151  BP: 120/82  Pulse: 80  Temp: 97 F (36.1 C)  TempSrc: Tympanic  Resp: 20    Labs reviewed: Basic Metabolic Panel:  Recent Labs  16/10/96 2130 10/15/12 2345 12/19/12 03/27/13  NA 137  --   --  140  K 5.7* 4.5  --  3.9  CL 102  --   --   --   CO2 27  --   --   --   GLUCOSE 129*  --   --   --   BUN 21  --   --  18  CREATININE 1.07  --   --  1.1  CALCIUM 10.4  --   --   --   TSH  --   --  1.70  --    Liver Function Tests:  Recent Labs  10/15/12 2130 03/27/13  AST 32 18  ALT 12 8*  ALKPHOS 59 51  BILITOT 0.4  --   PROT 6.7  --   ALBUMIN 3.4*  --    CBC:  Recent Labs  10/15/12 2130 12/19/12  WBC 9.4 6.8  NEUTROABS 7.9*  --   HGB 13.5 12.2*  HCT 38.4* 36*  MCV 96.0  --   PLT 219 217   Past Procedures: 04/27/13 X-ray R shoulder, humerus, elbow: no definite fractures or  dislocation can be identified.   Assessment/Plan Pain of right shoulder region Sustained the right shoulder pain from the fall this am in his room, very limited ROM with pain, unable to make a tight fist, X-ray of the right  humerus, shoulder, and scapula pending. The patient was instructed to immobilize the right shoulder for now.   Dementia  unspecified, without behavioral disturbance,  stopped Exelon and Namenda, now admitted to [facility] Care Unit for care, Ativan 1mg  q4hr prn for agitation.         Depression only takes Lexapro 5mg  started after his wife passed away about a year ago--off when he was in [place] since he had no c/o depressive mood. Relapsed since the patient admitted to Memory Care Unit-difficulty adjusting, outburst, angry, agitated at times--05/23/13 started Celexa 10mg  daily and observe the patient.         Fall Lack of safety awareness and increased frailty contribute to his falling--intensive supervision needed. Will obtain CBC, CMP, UA C/S in am.   Urinary frequency Takes Tamisulosin and Finasteride       Family/ Staff Communication: observe the patient  Goals of Care: SNF  Labs/tests ordered: CBC, CMP, Cath UA C/S, X-ray R shoulder/humerus/scapula.      Review of Systems  Constitutional: Negative.   HENT: Negative.  Negative for ear pain and dental problem.   Eyes: Negative.   Respiratory: Negative.   Cardiovascular: Negative.   Gastrointestinal: Negative.   Genitourinary: Positive for frequency.  Musculoskeletal: Positive for arthralgias and gait problem.       Tender right arm since his fall-worse since the fall this am--very limited ROM with pain.   Allergic/Immunologic: Negative.   Neurological:       Significant dementia.  Hematological: Negative.   Psychiatric/Behavioral: Positive for agitation. The patient is nervous/anxious.     Physical Exam  Constitutional: He is well-developed, well-nourished, and in no distress. No distress.  HENT:  Head: Normocephalic and atraumatic.  Right Ear: External ear normal.  Left Ear: External ear normal.  Nose: Nose normal.  Mouth/Throat: No oropharyngeal exudate.  Eyes: Conjunctivae and EOM are normal. Pupils are  equal, round, and reactive to light.  Corrective lenses.  Neck: No JVD present. No tracheal deviation present. No thyromegaly present.  Cardiovascular: Normal rate, regular rhythm, normal heart sounds and intact distal pulses.  Exam reveals no gallop and no friction rub.   No murmur heard. Pulmonary/Chest: Breath sounds normal. No respiratory distress. He has no wheezes. He has no rales. He exhibits no tenderness.  Abdominal: He exhibits no distension and no mass. There is no tenderness.  Musculoskeletal: He exhibits edema and tenderness.  The right shoulder limited ROM with pain sustained from fall this am.   Lymphadenopathy:    He has no cervical adenopathy.  Neurological: He is alert. No cranial nerve deficit.  Unstable gait.  Skin: No rash noted. No erythema. No pallor.  Psychiatric: His mood appears anxious. He is agitated. He expresses impulsivity. He exhibits a depressed mood. He is not concerned with wish fulfillment. He exhibits abnormal new learning ability, abnormal recent memory and abnormal remote memory.

## 2013-05-24 NOTE — Assessment & Plan Note (Signed)
only takes Lexapro 5mg  started after his wife passed away about a year ago--off when he was in [place] since he had no c/o depressive mood. Relapsed since the patient admitted to Memory Care Unit-difficulty adjusting, outburst, angry, agitated at times--05/23/13 started Celexa 10mg  daily and observe the patient.

## 2013-05-24 NOTE — Assessment & Plan Note (Signed)
Sustained the right shoulder pain from the fall this am in his room, very limited ROM with pain, unable to make a tight fist, X-ray of the right humerus, shoulder, and scapula pending. The patient was instructed to immobilize the right shoulder for now.

## 2013-05-24 NOTE — Assessment & Plan Note (Signed)
unspecified, without behavioral disturbance,  stopped Exelon and Namenda, now admitted to [facility] Care Unit for care, Ativan 1mg  q4hr prn for agitation.

## 2013-05-24 NOTE — Assessment & Plan Note (Signed)
Lack of safety awareness and increased frailty contribute to his falling--intensive supervision needed. Will obtain CBC, CMP, UA C/S in am.

## 2013-05-26 LAB — BASIC METABOLIC PANEL
BUN: 16 mg/dL (ref 4–21)
Glucose: 115 mg/dL
Potassium: 3.8 mmol/L (ref 3.4–5.3)
Sodium: 137 mmol/L (ref 137–147)

## 2013-05-26 LAB — CBC AND DIFFERENTIAL: Hemoglobin: 12.5 g/dL — AB (ref 13.5–17.5)

## 2013-05-26 LAB — HEPATIC FUNCTION PANEL: Alkaline Phosphatase: 57 U/L (ref 25–125)

## 2013-06-11 ENCOUNTER — Non-Acute Institutional Stay (SKILLED_NURSING_FACILITY): Payer: Medicare Other | Admitting: Nurse Practitioner

## 2013-06-11 DIAGNOSIS — F0390 Unspecified dementia without behavioral disturbance: Secondary | ICD-10-CM

## 2013-06-11 DIAGNOSIS — W19XXXA Unspecified fall, initial encounter: Secondary | ICD-10-CM

## 2013-06-11 DIAGNOSIS — F329 Major depressive disorder, single episode, unspecified: Secondary | ICD-10-CM

## 2013-06-11 DIAGNOSIS — M25519 Pain in unspecified shoulder: Secondary | ICD-10-CM

## 2013-06-11 DIAGNOSIS — K219 Gastro-esophageal reflux disease without esophagitis: Secondary | ICD-10-CM

## 2013-06-11 DIAGNOSIS — M25511 Pain in right shoulder: Secondary | ICD-10-CM

## 2013-06-11 DIAGNOSIS — J45909 Unspecified asthma, uncomplicated: Secondary | ICD-10-CM

## 2013-06-11 DIAGNOSIS — K5900 Constipation, unspecified: Secondary | ICD-10-CM

## 2013-06-11 DIAGNOSIS — W19XXXD Unspecified fall, subsequent encounter: Secondary | ICD-10-CM

## 2013-06-11 DIAGNOSIS — F32A Depression, unspecified: Secondary | ICD-10-CM

## 2013-06-11 DIAGNOSIS — M1990 Unspecified osteoarthritis, unspecified site: Secondary | ICD-10-CM

## 2013-06-11 DIAGNOSIS — R350 Frequency of micturition: Secondary | ICD-10-CM

## 2013-06-11 NOTE — Assessment & Plan Note (Signed)
Stable on daily Synbicort   

## 2013-06-11 NOTE — Assessment & Plan Note (Signed)
Multiple sites, foot, shoulder, and legs--takes Norco 1/2 bid  

## 2013-06-11 NOTE — Assessment & Plan Note (Signed)
Takes Tamisulosin and Finasteride  

## 2013-06-11 NOTE — Assessment & Plan Note (Addendum)
only takes Lexapro 5mg  started after his wife passed away about a year ago--off when he was in [place] since he had no c/o depressive mood. Relapsed since the patient admitted to Memory Care Unit-difficulty adjusting, outburst, angry, agitated at times--05/23/13 started Celexa 10mg  daily and observe the [id number]--may consider to dc it if he keeps falling. Obtain EKG

## 2013-06-11 NOTE — Progress Notes (Signed)
Patient ID: Nathan Andrade., male   DOB: 01-30-1916, 79 y.o.   MRN: [id number] Code Status: DNR  Allergies  Allergen Reactions  . Sulfa Antibiotics     As noted on [facility]    Chief Complaint  Patient presents with  . Medical Managment of Chronic Issues    s/p fall 05/24/13 and 06/04/13 and c/o the right shoulder pain.     HPI: Patient is a 79 y.o. male seen in the SNF at [facility] today for evaluation of  S/p fall, the right shoulder pain with limited ROM, Depression/anxiety and other chronic medical conditions.  Problem List Items Addressed This Visit   Asthma, chronic - Primary     Stable on daily Synbicort        Dementia      unspecified, without behavioral disturbance,  stopped Exelon and Namenda, now admitted to Memory Care Unit for care, Ativan 1mg  q4hr prn for agitation. Lack of safety awareness and increased frailty contribute to his falling.             Depression     only takes Lexapro 5mg  started after his wife passed away about a year ago--off when he was in [place] since he had no c/o depressive mood. Relapsed since the patient admitted to Memory Care Unit-difficulty adjusting, outburst, angry, agitated at times--05/23/13 started Celexa 10mg  daily and observe the patient-fellx2--may consider to dc it if he keeps falling. Obtain EKG            Fall     Lack of safety awareness and increased frailty contribute to his falling--intensive supervision needed. Frequent falling.       GERD (gastroesophageal reflux disease)     Stable, not on PPI, takes Zofran prn.       Osteoarthritis     Multiple sites, foot, shoulder, and legs--takes Norco 1/2 bid       Pain of right shoulder region     06/07/13 X-ray R clavicle, humerus, shoulder showed no fractures. 05/24/13 X-ray R humerus and shoulder showed no acute fx. The right shoulder's ROM is reduced for overhead activities--presently he is working with PT to restore his physical function.       Unspecified constipation     Stable on Docusate 200mg  po daily and Senokot S I bid and Polyethylene daily prn.         Urinary frequency     Takes Tamisulosin and Finasteride           Past Medical History  Diagnosis Date  . Asthma   . Melanoma   . Prostatic adenocarcinoma   . Dementia, unspecified, without behavioral disturbance   . Glaucoma   . Fibromatosis, plantar   . Malignant neoplasm of prostate   . Malignant melanoma of skin of scalp and neck   . Disorder of bone and cartilage, unspecified   . Personal history of other malignant neoplasm of skin   . Personal history of malignant melanoma of skin   . Glaucoma   . Diverticulosis   . Colon polyps   . Vitamin B 12 deficiency   . Vitamin D deficiency   . Insomnia   . Nephrolithiasis   . Urine incontinence   . History of TIA (transient ischemic attack)   . Eczema    Past Surgical History  Procedure Laterality Date  . Prostatectomy    . Inguinal hernia repair      right (mesh)  . Cataract extraction w/ intraocular lens  implant,  bilateral Bilateral   . Kidney stone surgery  09/2009   Social History:   reports that he quit smoking about 74 years ago. His smoking use included Pipe. He has never used smokeless tobacco. He reports that  drinks alcohol. He reports that he does not use illicit drugs.  Family History  Problem Relation Age of Onset  . Heart disease Mother   . Emphysema Father     Medications: Patient's Medications  New Prescriptions   No medications on file  Previous Medications   ASPIRIN EC 81 MG TABLET    Take 81 mg by mouth daily.   BUDESONIDE-FORMOTEROL (SYMBICORT) 160-4.5 MCG/ACT INHALER    Inhale 2 puffs into the lungs daily. Wait 1 minute between puffs, rinse mouth after use   CALCIUM CITRATE-VITAMIN D (CITRACAL MAXIMUM) 315-250 MG-UNIT TABS    Take 2 tablets by mouth daily with breakfast.   CHOLECALCIFEROL (VITAMIN D) 1000 UNITS TABLET    Take 1,000 Units by mouth daily.   CITALOPRAM  (CELEXA) 10 MG TABLET    Take 10 mg by mouth daily.   DOCUSATE SODIUM (COLACE) 100 MG CAPSULE    Take 200 mg by mouth daily.   FINASTERIDE (PROSCAR) 5 MG TABLET    Take 5 mg by mouth daily.   HYDROCODONE-ACETAMINOPHEN (NORCO/VICODIN) 5-325 MG PER TABLET    every 6 (six) hours as needed. Take 1/2 tablet three times a day   LATANOPROST (XALATAN) 0.005 % OPHTHALMIC SOLUTION    Place 1 drop into both eyes at bedtime.   LORATADINE (CLARITIN) 10 MG TABLET    Take 10 mg by mouth daily.   MULTIPLE VITAMINS-MINERALS (PRESERVISION AREDS 2 PO)    Take 1 capsule by mouth daily.   ONDANSETRON (ZOFRAN) 4 MG TABLET    Take 1 tablet (4 mg total) by mouth every 6 (six) hours.   OVER THE COUNTER MEDICATION    Take 2 oz by mouth daily at 6 PM. 1 oz of gin mix in with 1 capful of [place] every evening at 5pm   POLYETHYLENE GLYCOL (MIRALAX / GLYCOLAX) PACKET    Take 17 g by mouth daily as needed.    SENNA-DOCUSATE (SENOKOT S) 8.6-50 MG PER TABLET    Take 1 tablet by mouth 2 (two) times daily.   TAMSULOSIN (FLOMAX) 0.4 MG CAPS    Take 0.4 mg by mouth daily.   VITAMIN B-12 (CYANOCOBALAMIN) 1000 MCG TABLET    Take 1,000 mcg by mouth daily.  Modified Medications   No medications on file  Discontinued Medications   No medications on file   Filed Vitals:   06/11/13 1649  BP: 120/88  Pulse: 77  Temp: 96.3 F (35.7 C)  TempSrc: Tympanic  Resp: 16    Labs reviewed: Basic Metabolic Panel:  Recent Labs  78/29/56 2130 10/15/12 2345 12/19/12 03/27/13 05/26/13  NA 137  --   --  140 137  K 5.7* 4.5  --  3.9 3.8  CL 102  --   --   --   --   CO2 27  --   --   --   --   GLUCOSE 129*  --   --   --   --   BUN 21  --   --  18 16  CREATININE 1.07  --   --  1.1 0.9  CALCIUM 10.4  --   --   --   --   TSH  --   --  1.70  --   --  Liver Function Tests:  Recent Labs  10/15/12 2130 03/27/13 05/26/13  AST 32 18 16  ALT 12 8* 8*  ALKPHOS 59 51 57  BILITOT 0.4  --   --   PROT 6.7  --   --   ALBUMIN 3.4*  --    --    CBC:  Recent Labs  10/15/12 2130 12/19/12 05/26/13  WBC 9.4 6.8 7.7  NEUTROABS 7.9*  --   --   HGB 13.5 12.2* 12.5*  HCT 38.4* 36* 36*  MCV 96.0  --   --   PLT 219 217 225   Past Procedures:  04/27/13 X-ray R shoulder, humerus, elbow: no definite fractures or dislocation can be identified.   05/24/13 X-ray R humerus and shoulder: moderate diffuse osteopenia. Mild osteoarthritis. No acute fracture, malalignment, or lytic destructive lesion. Slight old healed fracture deformity at the right scapula.   06/07/13 X-ray R clavicle, humerus, shoulder: no fractures. Mild degenerative change acromioclavicular joint. Mild bony demineralization.   Assessment/Plan Asthma, chronic Stable on daily Synbicort      Dementia  unspecified, without behavioral disturbance,  stopped Exelon and Namenda, now admitted to [facility] Care Unit for care, Ativan 1mg  q4hr prn for agitation. Lack of safety awareness and increased frailty contribute to his falling.           Depression only takes Lexapro 5mg  started after his wife passed away about a year ago--off when he was in [place] since he had no c/o depressive mood. Relapsed since the patient admitted to Memory Care Unit-difficulty adjusting, outburst, angry, agitated at times--05/23/13 started Celexa 10mg  daily and observe the patient-fellx2--may consider to dc it if he keeps falling. Obtain EKG          Fall Lack of safety awareness and increased frailty contribute to his falling--intensive supervision needed. Frequent falling.     GERD (gastroesophageal reflux disease) Stable, not on PPI, takes Zofran prn.     Urinary frequency Takes Tamisulosin and Finasteride       Unspecified constipation Stable on Docusate 200mg  po daily and Senokot S I bid and Polyethylene daily prn.       Osteoarthritis Multiple sites, foot, shoulder, and legs--takes Norco 1/2 bid     Pain of right shoulder region 06/07/13 X-ray R clavicle,  humerus, shoulder showed no fractures. 05/24/13 X-ray R humerus and shoulder showed no acute fx. The right shoulder's ROM is reduced for overhead activities--presently he is working with PT to restore his physical function.       Family/ Staff Communication: observe the patient  Goals of Care: SNF  Labs/tests ordered: EKG     Review of Systems  Constitutional: Negative.   HENT: Negative.  Negative for ear pain and dental problem.   Eyes: Negative.   Respiratory: Negative.   Cardiovascular: Negative.   Gastrointestinal: Negative.   Genitourinary: Positive for frequency.  Musculoskeletal: Positive for arthralgias and gait problem.       Tender right arm since his fall-worse since the fall this am--very limited ROM with pain.   Allergic/Immunologic: Negative.   Neurological:       Significant dementia.  Hematological: Negative.   Psychiatric/Behavioral: Positive for agitation. The patient is nervous/anxious.     Physical Exam  Constitutional: He is well-developed, well-nourished, and in no distress. No distress.  HENT:  Head: Normocephalic and atraumatic.  Right Ear: External ear normal.  Left Ear: External ear normal.  Nose: Nose normal.  Mouth/Throat: No oropharyngeal exudate.  Eyes: Conjunctivae and EOM  are normal. Pupils are equal, round, and reactive to light.  Corrective lenses.  Neck: No JVD present. No tracheal deviation present. No thyromegaly present.  Cardiovascular: Normal rate, regular rhythm, normal heart sounds and intact distal pulses.  Exam reveals no gallop and no friction rub.   No murmur heard. Pulmonary/Chest: Breath sounds normal. No respiratory distress. He has no wheezes. He has no rales. He exhibits no tenderness.  Abdominal: He exhibits no distension and no mass. There is no tenderness.  Musculoskeletal: He exhibits edema and tenderness.  The right shoulder limited ROM with pain sustained from fall this am.   Lymphadenopathy:    He has no  cervical adenopathy.  Neurological: He is alert. No cranial nerve deficit.  Unstable gait.  Skin: No rash noted. No erythema. No pallor.  Psychiatric: His mood appears anxious. He is agitated. He expresses impulsivity. He exhibits a depressed mood. He is not concerned with wish fulfillment. He exhibits abnormal new learning ability, abnormal recent memory and abnormal remote memory.

## 2013-06-11 NOTE — Assessment & Plan Note (Signed)
06/07/13 X-ray R clavicle, humerus, shoulder showed no fractures. 05/24/13 X-ray R humerus and shoulder showed no acute fx. The right shoulder's ROM is reduced for overhead activities--presently he is working with PT to restore his physical function.

## 2013-06-11 NOTE — Assessment & Plan Note (Signed)
Lack of safety awareness and increased frailty contribute to his falling--intensive supervision needed. Frequent falling.

## 2013-06-11 NOTE — Assessment & Plan Note (Signed)
Stable on Docusate 200mg po daily and Senokot S I bid and Polyethylene daily prn.    

## 2013-06-11 NOTE — Assessment & Plan Note (Signed)
Stable, not on PPI, takes Zofran prn.    

## 2013-06-11 NOTE — Assessment & Plan Note (Signed)
unspecified, without behavioral disturbance,  stopped Exelon and Namenda, now admitted to [facility] Care Unit for care, Ativan 1mg  q4hr prn for agitation. Lack of safety awareness and increased frailty contribute to his falling.

## 2013-06-25 ENCOUNTER — Encounter: Payer: Self-pay | Admitting: Nurse Practitioner

## 2013-06-25 ENCOUNTER — Encounter: Payer: Self-pay | Admitting: *Deleted

## 2013-06-25 ENCOUNTER — Non-Acute Institutional Stay (SKILLED_NURSING_FACILITY): Payer: Medicare Other | Admitting: Nurse Practitioner

## 2013-06-25 DIAGNOSIS — R609 Edema, unspecified: Secondary | ICD-10-CM | POA: Insufficient documentation

## 2013-06-25 DIAGNOSIS — R350 Frequency of micturition: Secondary | ICD-10-CM

## 2013-06-25 DIAGNOSIS — M25511 Pain in right shoulder: Secondary | ICD-10-CM

## 2013-06-25 DIAGNOSIS — K5900 Constipation, unspecified: Secondary | ICD-10-CM

## 2013-06-25 DIAGNOSIS — F329 Major depressive disorder, single episode, unspecified: Secondary | ICD-10-CM

## 2013-06-25 DIAGNOSIS — K219 Gastro-esophageal reflux disease without esophagitis: Secondary | ICD-10-CM

## 2013-06-25 DIAGNOSIS — F0390 Unspecified dementia without behavioral disturbance: Secondary | ICD-10-CM

## 2013-06-25 DIAGNOSIS — J45909 Unspecified asthma, uncomplicated: Secondary | ICD-10-CM

## 2013-06-25 DIAGNOSIS — M25519 Pain in unspecified shoulder: Secondary | ICD-10-CM

## 2013-06-25 NOTE — Assessment & Plan Note (Signed)
Stable, off  Docusate 200mg po daily and continued Senokot S I daily  and Polyethylene daily prn.              

## 2013-06-25 NOTE — Assessment & Plan Note (Signed)
BLE 1+, mainly in his ankles, will continue with compression hosiery, weight weekly, observe for s/s of CHF

## 2013-06-25 NOTE — Assessment & Plan Note (Signed)
unspecified, without behavioral disturbance,  stopped Exelon and Namenda, now admitted to Memory Care Unit for care, Ativan 1mg  q4hr prn for agitation. Lack of safety awareness and increased frailty contribute to his falling.

## 2013-06-25 NOTE — Assessment & Plan Note (Signed)
Stable on daily Synbicort   

## 2013-06-25 NOTE — Progress Notes (Signed)
Patient ID: Nathan Andrade., male   DOB: 21-Mar-1916, 79 y.o.   MRN: [id number] Code Status: DNR  Allergies  Allergen Reactions  . Sulfa Antibiotics     As noted on [facility]    Chief Complaint  Patient presents with  . Medical Managment of Chronic Issues    BLE edema.     HPI: Patient is a 79 y.o. male seen in the SNF at [facility] today for evaluation of  BLE edema and other chronic medical conditions.  Problem List Items Addressed This Visit   Asthma, chronic     Stable on daily Synbicort          Dementia      unspecified, without behavioral disturbance,  stopped Exelon and Namenda, now admitted to Memory Care Unit for care, Ativan 1mg  q4hr prn for agitation. Lack of safety awareness and increased frailty contribute to his falling.               Depression     only takes Lexapro 5mg  started after his wife passed away about a year ago--off when he was in [place] since he had no c/o depressive mood. Relapsed since the patient admitted to Memory Care Unit-difficulty adjusting, outburst, angry, agitated at times--05/23/13 started Celexa 10mg  daily and observe the patient-fellx2--may consider to dc it if he keeps falling. EKG 06/12/13 showed regular RR, PR 204, QT 420. No acute ST-T              Edema - Primary     BLE 1+, mainly in his ankles, will continue with compression hosiery, weight weekly, observe for s/s of CHF     GERD (gastroesophageal reflux disease)     Stable, not on PPI, takes Zofran prn.         Pain of right shoulder region     06/07/13 X-ray R clavicle, humerus, shoulder showed no fractures. 05/24/13 X-ray R humerus and shoulder showed no acute fx. The right shoulder's ROM is reduced for overhead activities. Takes 1/2 Norco tid for pain.         Unspecified constipation     Stable, off  Docusate 200mg  po daily and continued Senokot S I daily  and Polyethylene daily prn.           Urinary frequency     Takes Tamisulosin and  Finasteride              Review of Systems:  Review of Systems  Constitutional: Positive for weight loss. Negative for fever, chills, malaise/fatigue and diaphoresis.  HENT: Positive for hearing loss. Negative for ear pain, congestion, sore throat and neck pain.   Eyes: Negative for pain, discharge and redness.  Respiratory: Negative for cough, sputum production, shortness of breath and wheezing.   Cardiovascular: Negative for chest pain, palpitations, orthopnea, claudication and leg swelling.  Gastrointestinal: Negative for heartburn, nausea, vomiting, abdominal pain, diarrhea, constipation and blood in stool.  Genitourinary: Positive for frequency. Negative for dysuria, urgency, hematuria and flank pain.  Musculoskeletal: Positive for joint pain and falls. Negative for myalgias and back pain.       The right shoulder pain with over head ROM  Skin: Negative for itching and rash.  Neurological: Negative for dizziness, tingling, tremors, sensory change, speech change, focal weakness, seizures, loss of consciousness, weakness and headaches.  Endo/Heme/Allergies: Negative for environmental allergies and polydipsia. Does not bruise/bleed easily.  Psychiatric/Behavioral: Positive for memory loss. Negative for depression and hallucinations. The patient is not nervous/anxious and  does not have insomnia.      Past Medical History  Diagnosis Date  . Asthma   . Melanoma   . Prostatic adenocarcinoma   . Dementia, unspecified, without behavioral disturbance   . Glaucoma   . Fibromatosis, plantar   . Malignant neoplasm of prostate   . Malignant melanoma of skin of scalp and neck   . Disorder of bone and cartilage, unspecified   . Personal history of other malignant neoplasm of skin   . Personal history of malignant melanoma of skin   . Glaucoma   . Diverticulosis   . Colon polyps   . Vitamin B 12 deficiency   . Vitamin D deficiency   . Insomnia   . Nephrolithiasis   . Urine  incontinence   . History of TIA (transient ischemic attack)   . Eczema    Past Surgical History  Procedure Laterality Date  . Prostatectomy    . Inguinal hernia repair      right (mesh)  . Cataract extraction w/ intraocular lens  implant, bilateral Bilateral   . Kidney stone surgery  09/2009   Social History:   reports that he quit smoking about 74 years ago. His smoking use included Pipe. He has never used smokeless tobacco. He reports that  drinks alcohol. He reports that he does not use illicit drugs.  Family History  Problem Relation Age of Onset  . Heart disease Mother   . Emphysema Father     Medications: Patient's Medications  New Prescriptions   No medications on file  Previous Medications   ASPIRIN EC 81 MG TABLET    Take 81 mg by mouth daily.   BUDESONIDE-FORMOTEROL (SYMBICORT) 160-4.5 MCG/ACT INHALER    Inhale 2 puffs into the lungs daily. Wait 1 minute between puffs, rinse mouth after use   CALCIUM CITRATE-VITAMIN D (CITRACAL MAXIMUM) 315-250 MG-UNIT TABS    Take 2 tablets by mouth daily with breakfast.   CHOLECALCIFEROL (VITAMIN D) 1000 UNITS TABLET    Take 1,000 Units by mouth daily.   CITALOPRAM (CELEXA) 10 MG TABLET    Take 10 mg by mouth daily.   FINASTERIDE (PROSCAR) 5 MG TABLET    Take 5 mg by mouth daily. *Do Not Crush* Wear gloves when handling to avoid exposure.   HYDROCODONE-ACETAMINOPHEN (NORCO/VICODIN) 5-325 MG PER TABLET    every 6 (six) hours as needed. Take 1/2 tablet three times a day   LATANOPROST (XALATAN) 0.005 % OPHTHALMIC SOLUTION    Place 1 drop into both eyes at bedtime. Wait 3-5 minutes between eye drops   LORATADINE (CLARITIN) 10 MG TABLET    Take 10 mg by mouth daily.   MULTIPLE VITAMINS-MINERALS (PRESERVISION AREDS 2 PO)    Take 1 capsule by mouth daily.   OVER THE COUNTER MEDICATION    Take 2 oz by mouth daily at 6 PM. 1 oz of gin mix in with 1 capful of [place] every evening at 5pm   POLYETHYLENE GLYCOL (MIRALAX / GLYCOLAX) PACKET    Take  17 g by mouth daily as needed. Fill cap to 17 gm mark, mix with 4-6 ounces of water and take by mouth every day as needed for constipation.   SENNA-DOCUSATE (SENOKOT S) 8.6-50 MG PER TABLET    Take 1 tablet by mouth daily.    TAMSULOSIN (FLOMAX) 0.4 MG CAPS    Take 0.4 mg by mouth daily. *Do not crush* * Do not open capsule** May discharge if patient passes stone.*  VITAMIN B-12 (CYANOCOBALAMIN) 1000 MCG TABLET    Take 1,000 mcg by mouth daily.  Modified Medications   Modified Medication Previous Medication   ONDANSETRON (ZOFRAN) 4 MG TABLET ondansetron (ZOFRAN) 4 MG tablet      Take 4 mg by mouth every 6 (six) hours. Every 6 hours as needed for nausea.    Take 1 tablet (4 mg total) by mouth every 6 (six) hours.  Discontinued Medications   DOCUSATE SODIUM (COLACE) 100 MG CAPSULE    Take 200 mg by mouth daily.     Physical Exam: Physical Exam  Constitutional: He is oriented to person, place, and time. He appears well-developed and well-nourished.  HENT:  Head: Normocephalic and atraumatic.  Eyes: Conjunctivae and EOM are normal. Pupils are equal, round, and reactive to light.  Neck: Normal range of motion. Neck supple. No JVD present. No thyromegaly present.  Cardiovascular: Normal rate, regular rhythm and normal heart sounds.   No murmur heard. Pulmonary/Chest: Effort normal. He has decreased breath sounds. He has no wheezes. He has no rales. He exhibits no tenderness.  Abdominal: Soft. Bowel sounds are normal. There is no tenderness.  Musculoskeletal: Normal range of motion. He exhibits no edema.  The right shoulder pain with over head ROM  Lymphadenopathy:    He has no cervical adenopathy.  Neurological: He is alert and oriented to person, place, and time. He has normal reflexes. No cranial nerve deficit. He exhibits normal muscle tone. Coordination normal.  Skin: Skin is warm and dry. No rash noted. No erythema.  Thickened long toenails trimmed during today's visit.   Psychiatric:  His mood appears not anxious. His affect is not angry, not blunt, not labile and not inappropriate. His speech is not rapid and/or pressured, not delayed, not tangential and not slurred. He is slowed. He is not agitated, not aggressive, not hyperactive, not withdrawn, not actively hallucinating and not combative. Thought content is not paranoid and not delusional. Cognition and memory are impaired. He does not express impulsivity or inappropriate judgment. He does not exhibit a depressed mood. He exhibits abnormal recent memory.    Filed Vitals:   06/25/13 1702  BP: 138/78  Pulse: 68  Temp: 96 F (35.6 C)  TempSrc: Tympanic  Resp: 20      Labs reviewed: Basic Metabolic Panel:  Recent Labs  16/10/96 2130 10/15/12 2345 12/19/12 03/27/13 05/26/13  NA 137  --   --  140 137  K 5.7* 4.5  --  3.9 3.8  CL 102  --   --   --   --   CO2 27  --   --   --   --   GLUCOSE 129*  --   --   --   --   BUN 21  --   --  18 16  CREATININE 1.07  --   --  1.1 0.9  CALCIUM 10.4  --   --   --   --   TSH  --   --  1.70  --   --    Liver Function Tests:  Recent Labs  10/15/12 2130 03/27/13 05/26/13  AST 32 18 16  ALT 12 8* 8*  ALKPHOS 59 51 57  BILITOT 0.4  --   --   PROT 6.7  --   --   ALBUMIN 3.4*  --   --     Recent Labs  10/15/12 2130  LIPASE 22   CBC:  Recent Labs  10/15/12 2130  12/19/12 05/26/13  WBC 9.4 6.8 7.7  NEUTROABS 7.9*  --   --   HGB 13.5 12.2* 12.5*  HCT 38.4* 36* 36*  MCV 96.0  --   --   PLT 219 217 225   Past Procedures:  06/07/13 X-ray R clavicle, humerus, shoulder: no fracture or dislocation seen. Mild degenerative change.   06/12/13 EKG: regular RR, PR 204, QT 420.    Assessment/Plan Edema BLE 1+, mainly in his ankles, will continue with compression hosiery, weight weekly, observe for s/s of CHF   Asthma, chronic Stable on daily Synbicort        Dementia  unspecified, without behavioral disturbance,  stopped Exelon and Namenda, now admitted to  [facility] for care, Ativan 1mg  q4hr prn for agitation. Lack of safety awareness and increased frailty contribute to his falling.             Depression only takes Lexapro 5mg  started after his wife passed away about a year ago--off when he was in [place] since he had no c/o depressive mood. Relapsed since the patient admitted to Memory Care Unit-difficulty adjusting, outburst, angry, agitated at times--05/23/13 started Celexa 10mg  daily and observe the patient-fellx2--may consider to dc it if he keeps falling. EKG 06/12/13 showed regular RR, PR 204, QT 420. No acute ST-T            GERD (gastroesophageal reflux disease) Stable, not on PPI, takes Zofran prn.       Pain of right shoulder region 06/07/13 X-ray R clavicle, humerus, shoulder showed no fractures. 05/24/13 X-ray R humerus and shoulder showed no acute fx. The right shoulder's ROM is reduced for overhead activities. Takes 1/2 Norco tid for pain.       Unspecified constipation Stable, off  Docusate 200mg  po daily and continued Senokot S I daily  and Polyethylene daily prn.         Urinary frequency Takes Tamisulosin and Finasteride           Family/ Staff Communication: observe the patient  Goals of Care: SNF  Labs/tests ordered: none

## 2013-06-25 NOTE — Assessment & Plan Note (Signed)
Takes Tamisulosin and Finasteride

## 2013-06-25 NOTE — Assessment & Plan Note (Signed)
only takes Lexapro 5mg  started after his wife passed away about a year ago--off when he was in [place] since he had no c/o depressive mood. Relapsed since the patient admitted to Memory Care Unit-difficulty adjusting, outburst, angry, agitated at times--05/23/13 started Celexa 10mg  daily and observe the patient-fellx2--may consider to dc it if he keeps falling. EKG 06/12/13 showed regular RR, PR 204, QT 420. No acute ST-T

## 2013-06-25 NOTE — Assessment & Plan Note (Addendum)
06/07/13 X-ray R clavicle, humerus, shoulder showed no fractures. 05/24/13 X-ray R humerus and shoulder showed no acute fx. The right shoulder's ROM is reduced for overhead activities. Takes 1/2 Norco tid for pain.

## 2013-06-25 NOTE — Assessment & Plan Note (Signed)
Stable, not on PPI, takes Zofran prn.    

## 2013-07-23 ENCOUNTER — Non-Acute Institutional Stay (SKILLED_NURSING_FACILITY): Payer: Medicare Other | Admitting: Nurse Practitioner

## 2013-07-23 ENCOUNTER — Encounter: Payer: Self-pay | Admitting: Nurse Practitioner

## 2013-07-23 DIAGNOSIS — M25511 Pain in right shoulder: Secondary | ICD-10-CM

## 2013-07-23 DIAGNOSIS — J45909 Unspecified asthma, uncomplicated: Secondary | ICD-10-CM

## 2013-07-23 DIAGNOSIS — F0390 Unspecified dementia without behavioral disturbance: Secondary | ICD-10-CM

## 2013-07-23 DIAGNOSIS — L218 Other seborrheic dermatitis: Secondary | ICD-10-CM

## 2013-07-23 DIAGNOSIS — R609 Edema, unspecified: Secondary | ICD-10-CM

## 2013-07-23 DIAGNOSIS — L219 Seborrheic dermatitis, unspecified: Secondary | ICD-10-CM

## 2013-07-23 DIAGNOSIS — F329 Major depressive disorder, single episode, unspecified: Secondary | ICD-10-CM

## 2013-07-23 DIAGNOSIS — K5900 Constipation, unspecified: Secondary | ICD-10-CM

## 2013-07-23 DIAGNOSIS — R350 Frequency of micturition: Secondary | ICD-10-CM

## 2013-07-23 DIAGNOSIS — M25519 Pain in unspecified shoulder: Secondary | ICD-10-CM

## 2013-07-23 DIAGNOSIS — R627 Adult failure to thrive: Secondary | ICD-10-CM

## 2013-07-23 NOTE — Progress Notes (Signed)
Patient ID: Nathan Santee., male   DOB: 1916-03-10, 79 y.o.   MRN: [id number]  Code Status: DNR  Allergies  Allergen Reactions  . Sulfa Antibiotics     As noted on [facility]    Chief Complaint  Patient presents with  . Medical Managment of Chronic Issues    scalp, forehead, facial scaly rash. Left ankle edema.   . Acute Visit  . Dementia    HPI: Patient is a 79 y.o. male seen in the SNF at [facility] today for evaluation of  Scaly rash scalp, forehead, face,  BLE edema and other chronic medical conditions.  Problem List Items Addressed This Visit   Asthma, chronic     Stable on daily Synbicort            Dementia      unspecified, without behavioral disturbance,  stopped Exelon and Namenda, now admitted to Memory Care Unit for care, Ativan 1mg  q4hr prn for agitation. Lack of safety awareness and increased frailty contribute to his falling.                 Depression     Relapsed since the patient admitted to Memory Care Unit-difficulty adjusting, outburst, angry, agitated at times--05/23/13 started Celexa 10mg  daily and observe the patient-fellx2--may consider to dc it if he keeps falling. EKG 06/12/13 showed regular RR, PR 204, QT 420. No acute ST-T. Stabilized.                 Edema     BLE 1+, mainly in his ankles, will continue with compression hosiery, weight daily x3 then weekly(staff reported R foot/ankle edema 07/19/13), observe for s/s of CHF       FTT (failure to thrive) in adult     Supportive and palliative care. Weights #139 in the past 3 months.       Pain of right shoulder region     06/07/13 X-ray R clavicle, humerus, shoulder showed no fractures. 05/24/13 X-ray R humerus and shoulder showed no acute fx. The right shoulder's ROM is reduced for overhead activities. Takes 1/2 Norco tid for pain-the patient stated it feels better.           Seborrheic dermatitis of scalp     Scalp, forehead, face--will apply Mycolog II  cream bid to affected areas. Observe the patient.     Unspecified constipation     Stable, off  Docusate 200mg  po daily and continued Senokot S I daily  and Polyethylene daily prn.             Urinary frequency - Primary     Takes Tamisulosin and Finasteride. No urinary retention.                Review of Systems:  Review of Systems  Constitutional: Positive for weight loss. Negative for fever, chills, malaise/fatigue and diaphoresis.  HENT: Positive for hearing loss. Negative for congestion, ear pain and sore throat.   Eyes: Negative for pain, discharge and redness.  Respiratory: Negative for cough, sputum production, shortness of breath and wheezing.   Cardiovascular: Negative for chest pain, palpitations, orthopnea, claudication and leg swelling.  Gastrointestinal: Negative for heartburn, nausea, vomiting, abdominal pain, diarrhea, constipation and blood in stool.  Genitourinary: Positive for frequency. Negative for dysuria, urgency, hematuria and flank pain.  Musculoskeletal: Positive for falls and joint pain. Negative for back pain, myalgias and neck pain.       The right shoulder pain with over head ROM  Skin: Positive for rash. Negative for itching.       Scaly, scalp, forehead, face.   Neurological: Negative for dizziness, tingling, tremors, sensory change, speech change, focal weakness, seizures, loss of consciousness, weakness and headaches.  Endo/Heme/Allergies: Negative for environmental allergies and polydipsia. Does not bruise/bleed easily.  Psychiatric/Behavioral: Positive for memory loss. Negative for depression and hallucinations. The patient is not nervous/anxious and does not have insomnia.      Past Medical History  Diagnosis Date  . Asthma   . Melanoma   . Prostatic adenocarcinoma   . Dementia, unspecified, without behavioral disturbance   . Glaucoma   . Fibromatosis, plantar   . Malignant neoplasm of prostate   . Malignant melanoma of skin  of scalp and neck   . Disorder of bone and cartilage, unspecified   . Personal history of other malignant neoplasm of skin   . Personal history of malignant melanoma of skin   . Glaucoma   . Diverticulosis   . Colon polyps   . Vitamin B 12 deficiency   . Vitamin D deficiency   . Insomnia   . Nephrolithiasis   . Urine incontinence   . History of TIA (transient ischemic attack)   . Eczema    Past Surgical History  Procedure Laterality Date  . Prostatectomy    . Inguinal hernia repair      right (mesh)  . Cataract extraction w/ intraocular lens  implant, bilateral Bilateral   . Kidney stone surgery  09/2009   Social History:   reports that he quit smoking about 74 years ago. His smoking use included Pipe. He has never used smokeless tobacco. He reports that he drinks alcohol. He reports that he does not use illicit drugs.  Family History  Problem Relation Age of Onset  . Heart disease Mother   . Emphysema Father     Medications: Patient's Medications  New Prescriptions   No medications on file  Previous Medications   ASPIRIN EC 81 MG TABLET    Take 81 mg by mouth daily.   BUDESONIDE-FORMOTEROL (SYMBICORT) 160-4.5 MCG/ACT INHALER    Inhale 2 puffs into the lungs daily. Wait 1 minute between puffs, rinse mouth after use   CALCIUM CITRATE-VITAMIN D (CITRACAL MAXIMUM) 315-250 MG-UNIT TABS    Take 2 tablets by mouth daily with breakfast.   CHOLECALCIFEROL (VITAMIN D) 1000 UNITS TABLET    Take 1,000 Units by mouth daily.   CITALOPRAM (CELEXA) 10 MG TABLET    Take 10 mg by mouth daily.   FINASTERIDE (PROSCAR) 5 MG TABLET    Take 5 mg by mouth daily. *Do Not Crush* Wear gloves when handling to avoid exposure.   HYDROCODONE-ACETAMINOPHEN (NORCO/VICODIN) 5-325 MG PER TABLET    every 6 (six) hours as needed. Take 1/2 tablet three times a day   LATANOPROST (XALATAN) 0.005 % OPHTHALMIC SOLUTION    Place 1 drop into both eyes at bedtime. Wait 3-5 minutes between eye drops   LORATADINE  (CLARITIN) 10 MG TABLET    Take 10 mg by mouth daily.   MULTIPLE VITAMINS-MINERALS (PRESERVISION AREDS 2 PO)    Take 1 capsule by mouth daily.   ONDANSETRON (ZOFRAN) 4 MG TABLET    Take 4 mg by mouth every 6 (six) hours. Every 6 hours as needed for nausea.   OVER THE COUNTER MEDICATION    Take 2 oz by mouth daily at 6 PM. 1 oz of gin mix in with 1 capful of [place] every evening at  5pm   POLYETHYLENE GLYCOL (MIRALAX / GLYCOLAX) PACKET    Take 17 g by mouth daily as needed. Fill cap to 17 gm mark, mix with 4-6 ounces of water and take by mouth every day as needed for constipation.   SENNA-DOCUSATE (SENOKOT S) 8.6-50 MG PER TABLET    Take 1 tablet by mouth daily.    TAMSULOSIN (FLOMAX) 0.4 MG CAPS    Take 0.4 mg by mouth daily. *Do not crush* * Do not open capsule** May discharge if patient passes stone.*   VITAMIN B-12 (CYANOCOBALAMIN) 1000 MCG TABLET    Take 1,000 mcg by mouth daily.  Modified Medications   No medications on file  Discontinued Medications   No medications on file     Physical Exam: Physical Exam  Constitutional: He is oriented to person, place, and time. He appears well-developed and well-nourished.  HENT:  Head: Normocephalic and atraumatic.  Eyes: Conjunctivae and EOM are normal. Pupils are equal, round, and reactive to light.  Neck: Normal range of motion. Neck supple. No JVD present. No thyromegaly present.  Cardiovascular: Normal rate, regular rhythm and normal heart sounds.   No murmur heard. Pulmonary/Chest: Effort normal. He has decreased breath sounds. He has no wheezes. He has no rales. He exhibits no tenderness.  Abdominal: Soft. Bowel sounds are normal. There is no tenderness.  Musculoskeletal: Normal range of motion. He exhibits no edema.  The right shoulder pain with over head ROM  Lymphadenopathy:    He has no cervical adenopathy.  Neurological: He is alert and oriented to person, place, and time. He has normal reflexes. No cranial nerve deficit. He  exhibits normal muscle tone. Coordination normal.  Skin: Skin is warm and dry. No rash noted. No erythema.  Scaly rash scalp, forehead, face.   Psychiatric: His mood appears not anxious. His affect is not angry, not blunt, not labile and not inappropriate. His speech is not rapid and/or pressured, not delayed, not tangential and not slurred. He is slowed. He is not agitated, not aggressive, not hyperactive, not withdrawn, not actively hallucinating and not combative. Thought content is not paranoid and not delusional. Cognition and memory are impaired. He does not express impulsivity or inappropriate judgment. He does not exhibit a depressed mood. He exhibits abnormal recent memory.    Filed Vitals:   07/23/13 1340  BP: 120/70  Pulse: 80  Temp: 97 F (36.1 C)  TempSrc: Tympanic  Resp: 20      Labs reviewed: Basic Metabolic Panel:  Recent Labs  18/56/31 2130 10/15/12 2345 12/19/12 03/27/13 05/26/13  NA 137  --   --  140 137  K 5.7* 4.5  --  3.9 3.8  CL 102  --   --   --   --   CO2 27  --   --   --   --   GLUCOSE 129*  --   --   --   --   BUN 21  --   --  18 16  CREATININE 1.07  --   --  1.1 0.9  CALCIUM 10.4  --   --   --   --   TSH  --   --  1.70  --   --    Liver Function Tests:  Recent Labs  10/15/12 2130 03/27/13 05/26/13  AST 32 18 16  ALT 12 8* 8*  ALKPHOS 59 51 57  BILITOT 0.4  --   --   PROT 6.7  --   --  ALBUMIN 3.4*  --   --     Recent Labs  10/15/12 2130  LIPASE 22   CBC:  Recent Labs  10/15/12 2130 12/19/12 05/26/13  WBC 9.4 6.8 7.7  NEUTROABS 7.9*  --   --   HGB 13.5 12.2* 12.5*  HCT 38.4* 36* 36*  MCV 96.0  --   --   PLT 219 217 225   Past Procedures:  06/07/13 X-ray R clavicle, humerus, shoulder: no fracture or dislocation seen. Mild degenerative change.   06/12/13 EKG: regular RR, PR 204, QT 420.    Assessment/Plan Urinary frequency Takes Tamisulosin and Finasteride. No urinary retention.           Unspecified  constipation Stable, off  Docusate 200mg  po daily and continued Senokot S I daily  and Polyethylene daily prn.           Pain of right shoulder region 06/07/13 X-ray R clavicle, humerus, shoulder showed no fractures. 05/24/13 X-ray R humerus and shoulder showed no acute fx. The right shoulder's ROM is reduced for overhead activities. Takes 1/2 Norco tid for pain-the patient stated it feels better.         FTT (failure to thrive) in adult Supportive and palliative care. Weights #139 in the past 3 months.     Edema BLE 1+, mainly in his ankles, will continue with compression hosiery, weight daily x3 then weekly(staff reported R foot/ankle edema 07/19/13), observe for s/s of CHF     Depression Relapsed since the patient admitted to Memory Care Unit-difficulty adjusting, outburst, angry, agitated at times--05/23/13 started Celexa 10mg  daily and observe the patient-fellx2--may consider to dc it if he keeps falling. EKG 06/12/13 showed regular RR, PR 204, QT 420. No acute ST-T. Stabilized.               Dementia  unspecified, without behavioral disturbance,  stopped Exelon and Namenda, now admitted to Memory Care Unit for care, Ativan 1mg  q4hr prn for agitation. Lack of safety awareness and increased frailty contribute to his falling.               Asthma, chronic Stable on daily Synbicort          Seborrheic dermatitis of scalp Scalp, forehead, face--will apply Mycolog II cream bid to affected areas. Observe the patient.     Family/ Staff Communication: observe the patient  Goals of Care: SNF  Labs/tests ordered: none

## 2013-07-23 NOTE — Assessment & Plan Note (Signed)
Scalp, forehead, face--will apply Mycolog II cream bid to affected areas. Observe the patient.

## 2013-07-23 NOTE — Assessment & Plan Note (Signed)
Takes Tamisulosin and Finasteride. No urinary retention.    

## 2013-07-23 NOTE — Assessment & Plan Note (Signed)
BLE 1+, mainly in his ankles, will continue with compression hosiery, weight daily x3 then weekly(staff reported R foot/ankle edema 07/19/13), observe for s/s of CHF

## 2013-07-23 NOTE — Assessment & Plan Note (Signed)
Stable, off  Docusate 200mg po daily and continued Senokot S I daily  and Polyethylene daily prn.              

## 2013-07-23 NOTE — Assessment & Plan Note (Signed)
06/07/13 X-ray R clavicle, humerus, shoulder showed no fractures. 05/24/13 X-ray R humerus and shoulder showed no acute fx. The right shoulder's ROM is reduced for overhead activities. Takes 1/2 Norco tid for pain-the patient stated it feels better.

## 2013-07-23 NOTE — Assessment & Plan Note (Signed)
Stable on daily Synbicort   

## 2013-07-23 NOTE — Assessment & Plan Note (Signed)
Supportive and palliative care. Weights #139 in the past 3 months.

## 2013-07-23 NOTE — Assessment & Plan Note (Signed)
Relapsed since the patient admitted to Memory Care Unit-difficulty adjusting, outburst, angry, agitated at times--05/23/13 started Celexa 10mg daily and observe the patient-fellx2--may consider to dc it if he keeps falling. EKG 06/12/13 showed regular RR, PR 204, QT 420. No acute ST-T. Stabilized.                  

## 2013-07-23 NOTE — Assessment & Plan Note (Signed)
unspecified, without behavioral disturbance,  stopped Exelon and Namenda, now admitted to Memory Care Unit for care, Ativan 1mg q4hr prn for agitation. Lack of safety awareness and increased frailty contribute to his falling.          

## 2013-08-08 ENCOUNTER — Encounter: Payer: Self-pay | Admitting: Nurse Practitioner

## 2013-08-08 ENCOUNTER — Non-Acute Institutional Stay (SKILLED_NURSING_FACILITY): Payer: Medicare Other | Admitting: Nurse Practitioner

## 2013-08-08 DIAGNOSIS — R350 Frequency of micturition: Secondary | ICD-10-CM

## 2013-08-08 DIAGNOSIS — L219 Seborrheic dermatitis, unspecified: Secondary | ICD-10-CM

## 2013-08-08 DIAGNOSIS — F3289 Other specified depressive episodes: Secondary | ICD-10-CM

## 2013-08-08 DIAGNOSIS — L218 Other seborrheic dermatitis: Secondary | ICD-10-CM

## 2013-08-08 DIAGNOSIS — F329 Major depressive disorder, single episode, unspecified: Secondary | ICD-10-CM

## 2013-08-08 DIAGNOSIS — W19XXXD Unspecified fall, subsequent encounter: Secondary | ICD-10-CM

## 2013-08-08 DIAGNOSIS — M1990 Unspecified osteoarthritis, unspecified site: Secondary | ICD-10-CM

## 2013-08-08 DIAGNOSIS — F0390 Unspecified dementia without behavioral disturbance: Secondary | ICD-10-CM

## 2013-08-08 DIAGNOSIS — K5900 Constipation, unspecified: Secondary | ICD-10-CM

## 2013-08-08 DIAGNOSIS — W19XXXA Unspecified fall, initial encounter: Secondary | ICD-10-CM

## 2013-08-08 DIAGNOSIS — J45909 Unspecified asthma, uncomplicated: Secondary | ICD-10-CM

## 2013-08-08 NOTE — Assessment & Plan Note (Signed)
Multiple sites, foot, shoulder, and legs--takes Norco 1/2 tid   

## 2013-08-08 NOTE — Assessment & Plan Note (Signed)
Stable on daily Synbicort   

## 2013-08-08 NOTE — Assessment & Plan Note (Signed)
Takes Tamisulosin and Finasteride. No urinary retention.    

## 2013-08-08 NOTE — Assessment & Plan Note (Signed)
Stable, off  Docusate 200mg po daily and continued Senokot S I daily  and Polyethylene daily prn.              

## 2013-08-08 NOTE — Assessment & Plan Note (Signed)
Scalp, forehead, face--improving  Mycolog II cream bid to affected areas. Observe the patient.

## 2013-08-08 NOTE — Assessment & Plan Note (Signed)
Relapsed since the patient admitted to Memory Care Unit-difficulty adjusting, outburst, angry, agitated at times--05/23/13 started Celexa 10mg  daily and increased to 20mg  since 08/02/13--smiles more.

## 2013-08-08 NOTE — Assessment & Plan Note (Signed)
unspecified, without behavioral disturbance,  stopped Exelon and Namenda, now admitted to Memory Care Unit for care, Ativan 1mg q4hr prn for agitation. Lack of safety awareness and increased frailty contribute to his falling.          

## 2013-08-08 NOTE — Assessment & Plan Note (Signed)
Fell 08/07/13 in dinning room when ambulated with walker-no apparent injury.

## 2013-08-08 NOTE — Progress Notes (Signed)
Patient ID: Nathan Andrade., male   DOB: 08/24/1916, 79 y.o.   MRN: [id number]  Code Status: DNR  Allergies  Allergen Reactions  . Sulfa Antibiotics     As noted on [facility]    Chief Complaint  Patient presents with  . Medical Managment of Chronic Issues    fall  . Dementia  . Acute Visit    HPI: Patient is a 79 y.o. male seen in the SNF at [facility] today for evaluation of  Scaly rash scalp, forehead, face, falling, dementia, and other chronic medical conditions.  Problem List Items Addressed This Visit   Asthma, chronic     Stable on daily Synbicort              Dementia      unspecified, without behavioral disturbance,  stopped Exelon and Namenda, now admitted to Memory Care Unit for care, Ativan 1mg  q4hr prn for agitation. Lack of safety awareness and increased frailty contribute to his falling.                   Depression - Primary     Relapsed since the patient admitted to Memory Care Unit-difficulty adjusting, outburst, angry, agitated at times--05/23/13 started Celexa 10mg  daily and increased to 20mg  since 08/02/13--smiles more.                   Fall     Fell 08/07/13 in dinning room when ambulated with walker-no apparent injury.     Osteoarthritis     Multiple sites, foot, shoulder, and legs--takes Norco 1/2 tid        Seborrheic dermatitis of scalp     Scalp, forehead, face--improving  Mycolog II cream bid to affected areas. Observe the patient.       Unspecified constipation     Stable, off  Docusate 200mg  po daily and continued Senokot S I daily  and Polyethylene daily prn.               Urinary frequency     Takes Tamisulosin and Finasteride. No urinary retention.                  Review of Systems:  Review of Systems  Constitutional: Positive for weight loss. Negative for fever, chills, malaise/fatigue and diaphoresis.  HENT: Positive for hearing loss. Negative for congestion, ear  pain and sore throat.   Eyes: Negative for pain, discharge and redness.  Respiratory: Negative for cough, sputum production, shortness of breath and wheezing.   Cardiovascular: Negative for chest pain, palpitations, orthopnea, claudication and leg swelling.  Gastrointestinal: Negative for heartburn, nausea, vomiting, abdominal pain, diarrhea, constipation and blood in stool.  Genitourinary: Positive for frequency. Negative for dysuria, urgency, hematuria and flank pain.  Musculoskeletal: Positive for falls and joint pain. Negative for back pain, myalgias and neck pain.       The right shoulder pain with over head ROM  Skin: Positive for rash. Negative for itching.       Scaly, scalp, forehead, face.   Neurological: Negative for dizziness, tingling, tremors, sensory change, speech change, focal weakness, seizures, loss of consciousness, weakness and headaches.  Endo/Heme/Allergies: Negative for environmental allergies and polydipsia. Does not bruise/bleed easily.  Psychiatric/Behavioral: Positive for memory loss. Negative for depression and hallucinations. The patient is not nervous/anxious and does not have insomnia.      Past Medical History  Diagnosis Date  . Asthma   . Melanoma   . Prostatic adenocarcinoma   .  Dementia, unspecified, without behavioral disturbance   . Glaucoma   . Fibromatosis, plantar   . Malignant neoplasm of prostate   . Malignant melanoma of skin of scalp and neck   . Disorder of bone and cartilage, unspecified   . Personal history of other malignant neoplasm of skin   . Personal history of malignant melanoma of skin   . Glaucoma   . Diverticulosis   . Colon polyps   . Vitamin B 12 deficiency   . Vitamin D deficiency   . Insomnia   . Nephrolithiasis   . Urine incontinence   . History of TIA (transient ischemic attack)   . Eczema    Past Surgical History  Procedure Laterality Date  . Prostatectomy    . Inguinal hernia repair      right (mesh)  .  Cataract extraction w/ intraocular lens  implant, bilateral Bilateral   . Kidney stone surgery  09/2009   Social History:   reports that he quit smoking about 74 years ago. His smoking use included Pipe. He has never used smokeless tobacco. He reports that he drinks alcohol. He reports that he does not use illicit drugs.  Family History  Problem Relation Age of Onset  . Heart disease Mother   . Emphysema Father     Medications: Patient's Medications  New Prescriptions   No medications on file  Previous Medications   ASPIRIN EC 81 MG TABLET    Take 81 mg by mouth daily.   BUDESONIDE-FORMOTEROL (SYMBICORT) 160-4.5 MCG/ACT INHALER    Inhale 2 puffs into the lungs daily. Wait 1 minute between puffs, rinse mouth after use   CALCIUM CITRATE-VITAMIN D (CITRACAL MAXIMUM) 315-250 MG-UNIT TABS    Take 2 tablets by mouth daily with breakfast.   CHOLECALCIFEROL (VITAMIN D) 1000 UNITS TABLET    Take 1,000 Units by mouth daily.   CITALOPRAM (CELEXA) 10 MG TABLET    Take 20 mg by mouth daily.    FINASTERIDE (PROSCAR) 5 MG TABLET    Take 5 mg by mouth daily. *Do Not Crush* Wear gloves when handling to avoid exposure.   HYDROCODONE-ACETAMINOPHEN (NORCO/VICODIN) 5-325 MG PER TABLET    every 6 (six) hours as needed. Take 1/2 tablet three times a day   LATANOPROST (XALATAN) 0.005 % OPHTHALMIC SOLUTION    Place 1 drop into both eyes at bedtime. Wait 3-5 minutes between eye drops   LORATADINE (CLARITIN) 10 MG TABLET    Take 10 mg by mouth daily.   MULTIPLE VITAMINS-MINERALS (PRESERVISION AREDS 2 PO)    Take 1 capsule by mouth daily.   ONDANSETRON (ZOFRAN) 4 MG TABLET    Take 4 mg by mouth every 6 (six) hours. Every 6 hours as needed for nausea.   OVER THE COUNTER MEDICATION    Take 2 oz by mouth daily at 6 PM. 1 oz of gin mix in with 1 capful of [place] every evening at 5pm   POLYETHYLENE GLYCOL (MIRALAX / GLYCOLAX) PACKET    Take 17 g by mouth daily as needed. Fill cap to 17 gm mark, mix with 4-6 ounces of  water and take by mouth every day as needed for constipation.   SENNA-DOCUSATE (SENOKOT S) 8.6-50 MG PER TABLET    Take 1 tablet by mouth daily.    TAMSULOSIN (FLOMAX) 0.4 MG CAPS    Take 0.4 mg by mouth daily. *Do not crush* * Do not open capsule** May discharge if patient passes stone.*   VITAMIN B-12 (CYANOCOBALAMIN)  1000 MCG TABLET    Take 1,000 mcg by mouth daily.  Modified Medications   No medications on file  Discontinued Medications   No medications on file     Physical Exam: Physical Exam  Constitutional: He is oriented to person, place, and time. He appears well-developed and well-nourished.  HENT:  Head: Normocephalic and atraumatic.  Eyes: Conjunctivae and EOM are normal. Pupils are equal, round, and reactive to light.  Neck: Normal range of motion. Neck supple. No JVD present. No thyromegaly present.  Cardiovascular: Normal rate, regular rhythm and normal heart sounds.   No murmur heard. Pulmonary/Chest: Effort normal. He has decreased breath sounds. He has no wheezes. He has no rales. He exhibits no tenderness.  Abdominal: Soft. Bowel sounds are normal. There is no tenderness.  Musculoskeletal: Normal range of motion. He exhibits no edema.  The right shoulder pain with over head ROM  Lymphadenopathy:    He has no cervical adenopathy.  Neurological: He is alert and oriented to person, place, and time. He has normal reflexes. No cranial nerve deficit. He exhibits normal muscle tone. Coordination normal.  Skin: Skin is warm and dry. No rash noted. No erythema.  Scaly rash scalp, forehead, face-healing.   Psychiatric: His mood appears not anxious. His affect is not angry, not blunt, not labile and not inappropriate. His speech is not rapid and/or pressured, not delayed, not tangential and not slurred. He is slowed. He is not agitated, not aggressive, not hyperactive, not withdrawn, not actively hallucinating and not combative. Thought content is not paranoid and not delusional.  Cognition and memory are impaired. He does not express impulsivity or inappropriate judgment. He does not exhibit a depressed mood. He exhibits abnormal recent memory.    Filed Vitals:   08/08/13 1707  BP: 106/60  Pulse: 82  Temp: 96 F (35.6 C)  TempSrc: Tympanic  Resp: 20      Labs reviewed: Basic Metabolic Panel:  Recent Labs  16/10/96 2130 10/15/12 2345 12/19/12 03/27/13 05/26/13  NA 137  --   --  140 137  K 5.7* 4.5  --  3.9 3.8  CL 102  --   --   --   --   CO2 27  --   --   --   --   GLUCOSE 129*  --   --   --   --   BUN 21  --   --  18 16  CREATININE 1.07  --   --  1.1 0.9  CALCIUM 10.4  --   --   --   --   TSH  --   --  1.70  --   --    Liver Function Tests:  Recent Labs  10/15/12 2130 03/27/13 05/26/13  AST 32 18 16  ALT 12 8* 8*  ALKPHOS 59 51 57  BILITOT 0.4  --   --   PROT 6.7  --   --   ALBUMIN 3.4*  --   --     Recent Labs  10/15/12 2130  LIPASE 22   CBC:  Recent Labs  10/15/12 2130 12/19/12 05/26/13  WBC 9.4 6.8 7.7  NEUTROABS 7.9*  --   --   HGB 13.5 12.2* 12.5*  HCT 38.4* 36* 36*  MCV 96.0  --   --   PLT 219 217 225   Past Procedures:  06/07/13 X-ray R clavicle, humerus, shoulder: no fracture or dislocation seen. Mild degenerative change.   06/12/13 EKG: regular  RR, PR 204, QT 420.    Assessment/Plan Depression Relapsed since the patient admitted to Memory Care Unit-difficulty adjusting, outburst, angry, agitated at times--05/23/13 started Celexa 10mg  daily and increased to 20mg  since 08/02/13--smiles more.                 Fall Fell 08/07/13 in dinning room when ambulated with walker-no apparent injury.   Urinary frequency Takes Tamisulosin and Finasteride. No urinary retention.             Unspecified constipation Stable, off  Docusate 200mg  po daily and continued Senokot S I daily  and Polyethylene daily prn.             Asthma, chronic Stable on daily  Synbicort            Dementia  unspecified, without behavioral disturbance,  stopped Exelon and Namenda, now admitted to Memory Care Unit for care, Ativan 1mg  q4hr prn for agitation. Lack of safety awareness and increased frailty contribute to his falling.                 Osteoarthritis Multiple sites, foot, shoulder, and legs--takes Norco 1/2 tid      Seborrheic dermatitis of scalp Scalp, forehead, face--improving  Mycolog II cream bid to affected areas. Observe the patient.       Family/ Staff Communication: observe the patient  Goals of Care: SNF  Labs/tests ordered: none

## 2013-09-17 ENCOUNTER — Non-Acute Institutional Stay (SKILLED_NURSING_FACILITY): Payer: Medicare Other | Admitting: Nurse Practitioner

## 2013-09-17 ENCOUNTER — Encounter: Payer: Self-pay | Admitting: Nurse Practitioner

## 2013-09-17 DIAGNOSIS — K5900 Constipation, unspecified: Secondary | ICD-10-CM

## 2013-09-17 DIAGNOSIS — K219 Gastro-esophageal reflux disease without esophagitis: Secondary | ICD-10-CM

## 2013-09-17 DIAGNOSIS — F0390 Unspecified dementia without behavioral disturbance: Secondary | ICD-10-CM

## 2013-09-17 DIAGNOSIS — M25511 Pain in right shoulder: Secondary | ICD-10-CM

## 2013-09-17 DIAGNOSIS — J45909 Unspecified asthma, uncomplicated: Secondary | ICD-10-CM

## 2013-09-17 DIAGNOSIS — F329 Major depressive disorder, single episode, unspecified: Secondary | ICD-10-CM

## 2013-09-17 DIAGNOSIS — M810 Age-related osteoporosis without current pathological fracture: Secondary | ICD-10-CM

## 2013-09-17 DIAGNOSIS — R627 Adult failure to thrive: Secondary | ICD-10-CM

## 2013-09-17 DIAGNOSIS — R609 Edema, unspecified: Secondary | ICD-10-CM

## 2013-09-17 DIAGNOSIS — M25519 Pain in unspecified shoulder: Secondary | ICD-10-CM

## 2013-09-17 NOTE — Assessment & Plan Note (Signed)
Stable, not on PPI, takes Zofran prn.    

## 2013-09-17 NOTE — Assessment & Plan Note (Signed)
Stable, off  Docusate 200mg po daily and continued Senokot S I daily  and Polyethylene daily prn.              

## 2013-09-17 NOTE — Assessment & Plan Note (Signed)
Dc Vit D, Cal--POA desires to discontinue non essential medications.

## 2013-09-17 NOTE — Assessment & Plan Note (Signed)
Discontinue non essential medications per POA's request: dc Vit B12, Vit D, Cal.

## 2013-09-17 NOTE — Assessment & Plan Note (Signed)
Relapsed since the patient admitted to Memory Care Unit-difficulty adjusting, outburst, angry, agitated at times--05/23/13 started Celexa 10mg daily and observe the patient-fellx2--may consider to dc it if he keeps falling. EKG 06/12/13 showed regular RR, PR 204, QT 420. No acute ST-T. Stabilized.                  

## 2013-09-17 NOTE — Assessment & Plan Note (Signed)
Multiple sites, foot, shoulder, and legs--takes Norco 1/2 tid   

## 2013-09-17 NOTE — Assessment & Plan Note (Signed)
unspecified, without behavioral disturbance,  stopped Exelon and Namenda, now admitted to Memory Care Unit for care, Ativan 1mg q4hr prn for agitation. Lack of safety awareness and increased frailty contribute to his falling.          

## 2013-09-17 NOTE — Progress Notes (Signed)
Patient ID: Nathan Andrade., male   DOB: Jan 30, 1916, 79 y.o.   MRN: [id number]   Code Status: DNR  Allergies  Allergen Reactions  . Sulfa Antibiotics     As noted on [facility]    Chief Complaint  Patient presents with  . Medical Managment of Chronic Issues    HPI: Patient is a 79 y.o. male seen in the SNF at [facility] today for evaluation of medications and other chronic medical conditions.  Problem List Items Addressed This Visit   Dementia - Primary      unspecified, without behavioral disturbance,  stopped Exelon and Namenda, now admitted to Memory Care Unit for care, Ativan 1mg  q4hr prn for agitation. Lack of safety awareness and increased frailty contribute to his falling.                   Asthma, chronic     Stable on daily Synbicort                Osteoporosis, unspecified     Dc Vit D, Cal--POA desires to discontinue non essential medications.     Depression     Relapsed since the patient admitted to Memory Care Unit-difficulty adjusting, outburst, angry, agitated at times--05/23/13 started Celexa 10mg  daily and observe the patient-fellx2--may consider to dc it if he keeps falling. EKG 06/12/13 showed regular RR, PR 204, QT 420. No acute ST-T. Stabilized.                   GERD (gastroesophageal reflux disease)     Stable, not on PPI, takes Zofran prn.           FTT (failure to thrive) in adult     Discontinue non essential medications per POA's request: dc Vit B12, Vit D, Cal.     Unspecified constipation     Stable, off  Docusate 200mg  po daily and continued Senokot S I daily  and Polyethylene daily prn.               Pain of right shoulder region     Multiple sites, foot, shoulder, and legs--takes Norco 1/2 tid          Edema     BLE 1+, mainly in his ankles, will continue with compression hosiery, weight daily x3 then weekly(staff reported R foot/ankle edema 07/19/13), observe for s/s of CHF             Review of Systems:  Review of Systems  Constitutional: Positive for weight loss. Negative for fever, chills, malaise/fatigue and diaphoresis.  HENT: Positive for hearing loss. Negative for congestion, ear pain and sore throat.   Eyes: Negative for pain, discharge and redness.  Respiratory: Negative for cough, sputum production, shortness of breath and wheezing.   Cardiovascular: Negative for chest pain, palpitations, orthopnea, claudication and leg swelling.  Gastrointestinal: Negative for heartburn, nausea, vomiting, abdominal pain, diarrhea, constipation and blood in stool.  Genitourinary: Positive for frequency. Negative for dysuria, urgency, hematuria and flank pain.  Musculoskeletal: Positive for falls and joint pain. Negative for back pain, myalgias and neck pain.       The right shoulder pain with over head ROM  Skin: Positive for rash. Negative for itching.       Scaly, scalp, forehead, face.   Neurological: Negative for dizziness, tingling, tremors, sensory change, speech change, focal weakness, seizures, loss of consciousness, weakness and headaches.  Endo/Heme/Allergies: Negative for environmental allergies and polydipsia. Does not bruise/bleed easily.  Psychiatric/Behavioral: Positive for memory loss. Negative for depression and hallucinations. The patient is not nervous/anxious and does not have insomnia.      Past Medical History  Diagnosis Date  . Asthma   . Melanoma   . Prostatic adenocarcinoma   . Dementia, unspecified, without behavioral disturbance   . Glaucoma   . Fibromatosis, plantar   . Malignant neoplasm of prostate   . Malignant melanoma of skin of scalp and neck   . Disorder of bone and cartilage, unspecified   . Personal history of other malignant neoplasm of skin   . Personal history of malignant melanoma of skin   . Glaucoma   . Diverticulosis   . Colon polyps   . Vitamin B 12 deficiency   . Vitamin D deficiency   . Insomnia   .  Nephrolithiasis   . Urine incontinence   . History of TIA (transient ischemic attack)   . Eczema    Past Surgical History  Procedure Laterality Date  . Prostatectomy    . Inguinal hernia repair      right (mesh)  . Cataract extraction w/ intraocular lens  implant, bilateral Bilateral   . Kidney stone surgery  09/2009   Social History:   reports that he quit smoking about 74 years ago. His smoking use included Pipe. He has never used smokeless tobacco. He reports that he drinks alcohol. He reports that he does not use illicit drugs.  Family History  Problem Relation Age of Onset  . Heart disease Mother   . Emphysema Father     Medications: Patient's Medications  New Prescriptions   No medications on file  Previous Medications   ASPIRIN EC 81 MG TABLET    Take 81 mg by mouth daily.   BUDESONIDE-FORMOTEROL (SYMBICORT) 160-4.5 MCG/ACT INHALER    Inhale 2 puffs into the lungs daily. Wait 1 minute between puffs, rinse mouth after use   CITALOPRAM (CELEXA) 10 MG TABLET    Take 20 mg by mouth daily.    FINASTERIDE (PROSCAR) 5 MG TABLET    Take 5 mg by mouth daily. *Do Not Crush* Wear gloves when handling to avoid exposure.   HYDROCODONE-ACETAMINOPHEN (NORCO/VICODIN) 5-325 MG PER TABLET    every 6 (six) hours as needed. Take 1/2 tablet three times a day   LATANOPROST (XALATAN) 0.005 % OPHTHALMIC SOLUTION    Place 1 drop into both eyes at bedtime. Wait 3-5 minutes between eye drops   LORATADINE (CLARITIN) 10 MG TABLET    Take 10 mg by mouth daily.   ONDANSETRON (ZOFRAN) 4 MG TABLET    Take 4 mg by mouth every 6 (six) hours. Every 6 hours as needed for nausea.   POLYETHYLENE GLYCOL (MIRALAX / GLYCOLAX) PACKET    Take 17 g by mouth daily as needed. Fill cap to 17 gm mark, mix with 4-6 ounces of water and take by mouth every day as needed for constipation.   SENNA-DOCUSATE (SENOKOT S) 8.6-50 MG PER TABLET    Take 1 tablet by mouth daily.    TAMSULOSIN (FLOMAX) 0.4 MG CAPS    Take 0.4 mg by  mouth daily. *Do not crush* * Do not open capsule** May discharge if patient passes stone.*  Modified Medications   No medications on file  Discontinued Medications   CALCIUM CITRATE-VITAMIN D (CITRACAL MAXIMUM) 315-250 MG-UNIT TABS    Take 2 tablets by mouth daily with breakfast.   CHOLECALCIFEROL (VITAMIN D) 1000 UNITS TABLET    Take 1,000 Units  by mouth daily.   MULTIPLE VITAMINS-MINERALS (PRESERVISION AREDS 2 PO)    Take 1 capsule by mouth daily.   OVER THE COUNTER MEDICATION    Take 2 oz by mouth daily at 6 PM. 1 oz of gin mix in with 1 capful of [place] every evening at 5pm   VITAMIN B-12 (CYANOCOBALAMIN) 1000 MCG TABLET    Take 1,000 mcg by mouth daily.     Physical Exam: Physical Exam  Constitutional: He is oriented to person, place, and time. He appears well-developed and well-nourished.  HENT:  Head: Normocephalic and atraumatic.  Eyes: Conjunctivae and EOM are normal. Pupils are equal, round, and reactive to light.  Neck: Normal range of motion. Neck supple. No JVD present. No thyromegaly present.  Cardiovascular: Normal rate, regular rhythm and normal heart sounds.   No murmur heard. Pulmonary/Chest: Effort normal. He has decreased breath sounds. He has no wheezes. He has no rales. He exhibits no tenderness.  Abdominal: Soft. Bowel sounds are normal. There is no tenderness.  Musculoskeletal: Normal range of motion. He exhibits no edema.  The right shoulder pain with over head ROM  Lymphadenopathy:    He has no cervical adenopathy.  Neurological: He is alert and oriented to person, place, and time. He has normal reflexes. No cranial nerve deficit. He exhibits normal muscle tone. Coordination normal.  Skin: Skin is warm and dry. No rash noted. No erythema.  Scaly rash scalp, forehead, face-healing.   Psychiatric: His mood appears not anxious. His affect is not angry, not blunt, not labile and not inappropriate. His speech is not rapid and/or pressured, not delayed, not  tangential and not slurred. He is slowed. He is not agitated, not aggressive, not hyperactive, not withdrawn, not actively hallucinating and not combative. Thought content is not paranoid and not delusional. Cognition and memory are impaired. He does not express impulsivity or inappropriate judgment. He does not exhibit a depressed mood. He exhibits abnormal recent memory.    Filed Vitals:   09/17/13 1503  BP: 108/64  Pulse: 86  Temp: 97.6 F (36.4 C)  TempSrc: Tympanic  Resp: 20      Labs reviewed: Basic Metabolic Panel:  Recent Labs  40/98/11 2130 10/15/12 2345 12/19/12 03/27/13 05/26/13  NA 137  --   --  140 137  K 5.7* 4.5  --  3.9 3.8  CL 102  --   --   --   --   CO2 27  --   --   --   --   GLUCOSE 129*  --   --   --   --   BUN 21  --   --  18 16  CREATININE 1.07  --   --  1.1 0.9  CALCIUM 10.4  --   --   --   --   TSH  --   --  1.70  --   --    Liver Function Tests:  Recent Labs  10/15/12 2130 03/27/13 05/26/13  AST 32 18 16  ALT 12 8* 8*  ALKPHOS 59 51 57  BILITOT 0.4  --   --   PROT 6.7  --   --   ALBUMIN 3.4*  --   --     Recent Labs  10/15/12 2130  LIPASE 22   CBC:  Recent Labs  10/15/12 2130 12/19/12 05/26/13  WBC 9.4 6.8 7.7  NEUTROABS 7.9*  --   --   HGB 13.5 12.2* 12.5*  HCT 38.4*  36* 36*  MCV 96.0  --   --   PLT 219 217 225    Past Procedures:  06/07/13 X-ray R clavicle, humerus, shoulder: no fracture or dislocation seen. Mild degenerative change.   06/12/13 EKG: regular RR, PR 204, QT 420.   Assessment/Plan Dementia  unspecified, without behavioral disturbance,  stopped Exelon and Namenda, now admitted to Memory Care Unit for care, Ativan 1mg  q4hr prn for agitation. Lack of safety awareness and increased frailty contribute to his falling.                 Asthma, chronic Stable on daily Synbicort              Osteoporosis, unspecified Dc Vit D, Cal--POA desires to discontinue non essential medications.    Depression Relapsed since the patient admitted to Memory Care Unit-difficulty adjusting, outburst, angry, agitated at times--05/23/13 started Celexa 10mg  daily and observe the patient-fellx2--may consider to dc it if he keeps falling. EKG 06/12/13 showed regular RR, PR 204, QT 420. No acute ST-T. Stabilized.                 GERD (gastroesophageal reflux disease) Stable, not on PPI, takes Zofran prn.         Unspecified constipation Stable, off  Docusate 200mg  po daily and continued Senokot S I daily  and Polyethylene daily prn.             Pain of right shoulder region Multiple sites, foot, shoulder, and legs--takes Norco 1/2 tid        Edema BLE 1+, mainly in his ankles, will continue with compression hosiery, weight daily x3 then weekly(staff reported R foot/ankle edema 07/19/13), observe for s/s of CHF       FTT (failure to thrive) in adult Discontinue non essential medications per POA's request: dc Vit B12, Vit D, Cal.     Family/ Staff Communication: observe the patient  Goals of Care: SNF  Labs/tests ordered: none

## 2013-09-17 NOTE — Assessment & Plan Note (Signed)
Stable on daily Synbicort   

## 2013-09-17 NOTE — Assessment & Plan Note (Signed)
BLE 1+, mainly in his ankles, will continue with compression hosiery, weight daily x3 then weekly(staff reported R foot/ankle edema 07/19/13), observe for s/s of CHF    

## 2013-09-24 ENCOUNTER — Emergency Department (HOSPITAL_COMMUNITY): Payer: Medicare Other

## 2013-09-24 ENCOUNTER — Emergency Department (HOSPITAL_COMMUNITY)
Admission: EM | Admit: 2013-09-24 | Discharge: 2013-09-25 | Disposition: A | Discharge status: Skilled Nursing Facility | Payer: Medicare Other | Attending: Emergency Medicine | Admitting: Emergency Medicine

## 2013-09-24 ENCOUNTER — Encounter (HOSPITAL_COMMUNITY): Payer: Self-pay | Admitting: Emergency Medicine

## 2013-09-24 DIAGNOSIS — Z7982 Long term (current) use of aspirin: Secondary | ICD-10-CM | POA: Insufficient documentation

## 2013-09-24 DIAGNOSIS — M899 Disorder of bone, unspecified: Secondary | ICD-10-CM | POA: Insufficient documentation

## 2013-09-24 DIAGNOSIS — Z8669 Personal history of other diseases of the nervous system and sense organs: Secondary | ICD-10-CM | POA: Insufficient documentation

## 2013-09-24 DIAGNOSIS — M949 Disorder of cartilage, unspecified: Secondary | ICD-10-CM

## 2013-09-24 DIAGNOSIS — Z8546 Personal history of malignant neoplasm of prostate: Secondary | ICD-10-CM | POA: Insufficient documentation

## 2013-09-24 DIAGNOSIS — F0390 Unspecified dementia without behavioral disturbance: Secondary | ICD-10-CM | POA: Insufficient documentation

## 2013-09-24 DIAGNOSIS — Z8673 Personal history of transient ischemic attack (TIA), and cerebral infarction without residual deficits: Secondary | ICD-10-CM | POA: Insufficient documentation

## 2013-09-24 DIAGNOSIS — Z87442 Personal history of urinary calculi: Secondary | ICD-10-CM | POA: Insufficient documentation

## 2013-09-24 DIAGNOSIS — Z85828 Personal history of other malignant neoplasm of skin: Secondary | ICD-10-CM | POA: Insufficient documentation

## 2013-09-24 DIAGNOSIS — Z860100 Personal history of colon polyps, unspecified: Secondary | ICD-10-CM | POA: Insufficient documentation

## 2013-09-24 DIAGNOSIS — Z87448 Personal history of other diseases of urinary system: Secondary | ICD-10-CM | POA: Insufficient documentation

## 2013-09-24 DIAGNOSIS — IMO0002 Reserved for concepts with insufficient information to code with codable children: Secondary | ICD-10-CM | POA: Insufficient documentation

## 2013-09-24 DIAGNOSIS — G4700 Insomnia, unspecified: Secondary | ICD-10-CM | POA: Insufficient documentation

## 2013-09-24 DIAGNOSIS — Z8719 Personal history of other diseases of the digestive system: Secondary | ICD-10-CM | POA: Insufficient documentation

## 2013-09-24 DIAGNOSIS — Y9210 Unspecified residential institution as the place of occurrence of the external cause: Secondary | ICD-10-CM | POA: Insufficient documentation

## 2013-09-24 DIAGNOSIS — W19XXXA Unspecified fall, initial encounter: Secondary | ICD-10-CM | POA: Insufficient documentation

## 2013-09-24 DIAGNOSIS — Z79899 Other long term (current) drug therapy: Secondary | ICD-10-CM | POA: Insufficient documentation

## 2013-09-24 DIAGNOSIS — Z872 Personal history of diseases of the skin and subcutaneous tissue: Secondary | ICD-10-CM | POA: Insufficient documentation

## 2013-09-24 DIAGNOSIS — Z85820 Personal history of malignant melanoma of skin: Secondary | ICD-10-CM | POA: Insufficient documentation

## 2013-09-24 DIAGNOSIS — S0180XA Unspecified open wound of other part of head, initial encounter: Secondary | ICD-10-CM | POA: Insufficient documentation

## 2013-09-24 DIAGNOSIS — Y939 Activity, unspecified: Secondary | ICD-10-CM | POA: Insufficient documentation

## 2013-09-24 DIAGNOSIS — Z87891 Personal history of nicotine dependence: Secondary | ICD-10-CM | POA: Insufficient documentation

## 2013-09-24 LAB — CBC WITH DIFFERENTIAL/PLATELET
BASOS ABS: 0 10*3/uL (ref 0.0–0.1)
Basophils Relative: 1 % (ref 0–1)
Eosinophils Absolute: 0.1 10*3/uL (ref 0.0–0.7)
Eosinophils Relative: 2 % (ref 0–5)
HCT: 37.3 % — ABNORMAL LOW (ref 39.0–52.0)
Hemoglobin: 12.9 g/dL — ABNORMAL LOW (ref 13.0–17.0)
LYMPHS ABS: 0.6 10*3/uL — AB (ref 0.7–4.0)
LYMPHS PCT: 12 % (ref 12–46)
MCH: 33.1 pg (ref 26.0–34.0)
MCHC: 34.6 g/dL (ref 30.0–36.0)
MCV: 95.6 fL (ref 78.0–100.0)
Monocytes Absolute: 0.6 10*3/uL (ref 0.1–1.0)
Monocytes Relative: 11 % (ref 3–12)
NEUTROS ABS: 3.9 10*3/uL (ref 1.7–7.7)
NEUTROS PCT: 74 % (ref 43–77)
PLATELETS: 211 10*3/uL (ref 150–400)
RBC: 3.9 MIL/uL — AB (ref 4.22–5.81)
RDW: 13.5 % (ref 11.5–15.5)
WBC: 5.3 10*3/uL (ref 4.0–10.5)

## 2013-09-24 LAB — CBC AND DIFFERENTIAL: WBC: 5.3 10*3/mL

## 2013-09-24 LAB — TROPONIN I

## 2013-09-24 LAB — BASIC METABOLIC PANEL
BUN: 17 mg/dL (ref 4–21)
BUN: 17 mg/dL (ref 6–23)
CO2: 29 meq/L (ref 19–32)
CREATININE: 0.8 mg/dL (ref 0.6–1.3)
Calcium: 9.9 mg/dL (ref 8.4–10.5)
Chloride: 100 mEq/L (ref 96–112)
Creatinine, Ser: 0.83 mg/dL (ref 0.50–1.35)
GFR calc Af Amer: 84 mL/min — ABNORMAL LOW (ref >90)
GFR calc non Af Amer: 72 mL/min — ABNORMAL LOW (ref >90)
GLUCOSE: 112 mg/dL
GLUCOSE: 112 mg/dL — AB (ref 70–99)
POTASSIUM: 3.7 mmol/L (ref 3.4–5.3)
POTASSIUM: 3.7 meq/L (ref 3.7–5.3)
SODIUM: 138 meq/L (ref 137–147)
Sodium: 138 mmol/L (ref 137–147)

## 2013-09-24 MED ORDER — FENTANYL CITRATE 0.05 MG/ML IJ SOLN
25.0000 ug | Freq: Once | INTRAMUSCULAR | Status: AC
  Administered 2013-09-24: 25 ug via INTRAVENOUS
  Filled 2013-09-24: qty 2

## 2013-09-24 NOTE — ED Notes (Signed)
Pt last tetanus shot in 2007. Dr. Betsey Holiday aware.

## 2013-09-24 NOTE — ED Notes (Signed)
[facility] notified.  ?

## 2013-09-24 NOTE — ED Notes (Signed)
Per ems, had unwitnessed fall at [facility], found face down. Deep laceration to left temple, not on blood thinners. Unsure if LOC, pt has HX of dementia, combative on ems truck, pt unable to give accurate description of what happened due to dementia. Sats were low with ems, placed on 2L oxygen. Dr. Betsey Holiday at bedside. EMS placed pt on Cspine, LSB, and headblocks. Ems states 15-20 mls of blood were on the floor.

## 2013-09-24 NOTE — Discharge Instructions (Signed)
Sutures need to be removed in 7 days  Laceration Care, Adult A laceration is a cut or lesion that goes through all layers of the skin and into the tissue just beneath the skin. TREATMENT  Some lacerations may not require closure. Some lacerations may not be able to be closed due to an increased risk of infection. It is important to see your caregiver as soon as possible after an injury to minimize the risk of infection and maximize the opportunity for successful closure. If closure is appropriate, pain medicines may be given, if needed. The wound will be cleaned to help prevent infection. Your caregiver will use stitches (sutures), staples, wound glue (adhesive), or skin adhesive strips to repair the laceration. These tools bring the skin edges together to allow for faster healing and a better cosmetic outcome. However, all wounds will heal with a scar. Once the wound has healed, scarring can be minimized by covering the wound with sunscreen during the day for 1 full year. HOME CARE INSTRUCTIONS  For sutures or staples:  Keep the wound clean and dry.  If you were given a bandage (dressing), you should change it at least once a day. Also, change the dressing if it becomes wet or dirty, or as directed by your caregiver.  Wash the wound with soap and water 2 times a day. Rinse the wound off with water to remove all soap. Pat the wound dry with a clean towel.  After cleaning, apply a thin layer of the antibiotic ointment as recommended by your caregiver. This will help prevent infection and keep the dressing from sticking.  You may shower as usual after the first 24 hours. Do not soak the wound in water until the sutures are removed.  Only take over-the-counter or prescription medicines for pain, discomfort, or fever as directed by your caregiver.  Get your sutures or staples removed as directed by your caregiver. For skin adhesive strips:  Keep the wound clean and dry.  Do not get the skin  adhesive strips wet. You may bathe carefully, using caution to keep the wound dry.  If the wound gets wet, pat it dry with a clean towel.  Skin adhesive strips will fall off on their own. You may trim the strips as the wound heals. Do not remove skin adhesive strips that are still stuck to the wound. They will fall off in time. For wound adhesive:  You may briefly wet your wound in the shower or bath. Do not soak or scrub the wound. Do not swim. Avoid periods of heavy perspiration until the skin adhesive has fallen off on its own. After showering or bathing, gently pat the wound dry with a clean towel.  Do not apply liquid medicine, cream medicine, or ointment medicine to your wound while the skin adhesive is in place. This may loosen the film before your wound is healed.  If a dressing is placed over the wound, be careful not to apply tape directly over the skin adhesive. This may cause the adhesive to be pulled off before the wound is healed.  Avoid prolonged exposure to sunlight or tanning lamps while the skin adhesive is in place. Exposure to ultraviolet light in the first year will darken the scar.  The skin adhesive will usually remain in place for 5 to 10 days, then naturally fall off the skin. Do not pick at the adhesive film. You may need a tetanus shot if:  You cannot remember when you had your last  tetanus shot.  You have never had a tetanus shot. If you get a tetanus shot, your arm may swell, get red, and feel warm to the touch. This is common and not a problem. If you need a tetanus shot and you choose not to have one, there is a rare chance of getting tetanus. Sickness from tetanus can be serious. SEEK MEDICAL CARE IF:   You have redness, swelling, or increasing pain in the wound.  You see a red line that goes away from the wound.  You have yellowish-white fluid (pus) coming from the wound.  You have a fever.  You notice a bad smell coming from the wound or  dressing.  Your wound breaks open before or after sutures have been removed.  You notice something coming out of the wound such as wood or glass.  Your wound is on your hand or foot and you cannot move a finger or toe. SEEK IMMEDIATE MEDICAL CARE IF:   Your pain is not controlled with prescribed medicine.  You have severe swelling around the wound causing pain and numbness or a change in color in your arm, hand, leg, or foot.  Your wound splits open and starts bleeding.  You have worsening numbness, weakness, or loss of function of any joint around or beyond the wound.  You develop painful lumps near the wound or on the skin anywhere on your body. MAKE SURE YOU:   Understand these instructions.  Will watch your condition.  Will get help right away if you are not doing well or get worse. Document Released: 09/06/2005 Document Revised: 11/29/2011 Document Reviewed: 03/02/2011 [employer] Patient Information 2014 [place], [place].

## 2013-09-24 NOTE — ED Provider Notes (Signed)
CSN: [id number]     Arrival date & time 09/24/13  2014 History   First MD Initiated Contact with Patient 09/24/13 2018     Chief Complaint  Patient presents with  . Fall   (Consider location/radiation/quality/duration/timing/severity/associated sxs/prior Treatment) HPI Comments: Patient brought to the ER by EMS from skilled nursing facility. Patient had a non-witnessed fall. Staff that he patient that when they found him he was lying on the ground with a laceration on his forehead. He was awake. Patient has a history of severe dementia, is at his normal baseline according to staff and EMS. EMS reports that he was agitated and somewhat combative during transport, but this is his normal baseline. Patient cannot give any information arrival due to his chronic dementia. Level V Caveat due to dementia.  Patient is a 80 y.o. male presenting with fall.  Fall    Past Medical History  Diagnosis Date  . Asthma   . Melanoma   . Prostatic adenocarcinoma   . Dementia, unspecified, without behavioral disturbance   . Glaucoma   . Fibromatosis, plantar   . Malignant neoplasm of prostate   . Malignant melanoma of skin of scalp and neck   . Disorder of bone and cartilage, unspecified   . Personal history of other malignant neoplasm of skin   . Personal history of malignant melanoma of skin   . Glaucoma   . Diverticulosis   . Colon polyps   . Vitamin B 12 deficiency   . Vitamin D deficiency   . Insomnia   . Nephrolithiasis   . Urine incontinence   . History of TIA (transient ischemic attack)   . Eczema    Past Surgical History  Procedure Laterality Date  . Prostatectomy    . Inguinal hernia repair      right (mesh)  . Cataract extraction w/ intraocular lens  implant, bilateral Bilateral   . Kidney stone surgery  09/2009   Family History  Problem Relation Age of Onset  . Heart disease Mother   . Emphysema Father    History  Substance Use Topics  . Smoking status: Former Smoker   Types: Pipe    Quit date: 01/12/1939  . Smokeless tobacco: Never Used  . Alcohol Use: Yes     Comment: 1 oz Gin mixed with [place] every evening as needed    Review of Systems  Unable to perform ROS: Dementia    Allergies  Sulfa antibiotics  Home Medications   Current Outpatient Rx  Name  Route  Sig  Dispense  Refill  . aspirin EC 81 MG tablet   Oral   Take 81 mg by mouth daily.         . budesonide-formoterol (SYMBICORT) 160-4.5 MCG/ACT inhaler   Inhalation   Inhale 2 puffs into the lungs daily. Wait 1 minute between puffs, rinse mouth after use         . citalopram (CELEXA) 10 MG tablet   Oral   Take 20 mg by mouth daily.          . finasteride (PROSCAR) 5 MG tablet   Oral   Take 5 mg by mouth daily. *Do Not Crush* Wear gloves when handling to avoid exposure.         Marland Kitchen HYDROcodone-acetaminophen (NORCO/VICODIN) 5-325 MG per tablet      every 6 (six) hours as needed. Take 1/2 tablet three times a day         . latanoprost (XALATAN) 0.005 % ophthalmic  solution   Both Eyes   Place 1 drop into both eyes at bedtime. Wait 3-5 minutes between eye drops         . loratadine (CLARITIN) 10 MG tablet   Oral   Take 10 mg by mouth daily.         . ondansetron (ZOFRAN) 4 MG tablet   Oral   Take 4 mg by mouth every 6 (six) hours. Every 6 hours as needed for nausea.         . polyethylene glycol (MIRALAX / GLYCOLAX) packet   Oral   Take 17 g by mouth daily as needed. Fill cap to 17 gm mark, mix with 4-6 ounces of water and take by mouth every day as needed for constipation.         . senna-docusate (SENOKOT S) 8.6-50 MG per tablet   Oral   Take 1 tablet by mouth daily.          . tamsulosin (FLOMAX) 0.4 MG CAPS   Oral   Take 0.4 mg by mouth daily. *Do not crush* * Do not open capsule** May discharge if patient passes stone.*          SpO2 94% Physical Exam  Constitutional: He appears well-developed and well-nourished. No distress.  HENT:    Head: Normocephalic and atraumatic.    Right Ear: Hearing normal.  Left Ear: Hearing normal.  Nose: Nose normal.  Mouth/Throat: Oropharynx is clear and moist and mucous membranes are normal.  Eyes: Conjunctivae and EOM are normal. Pupils are equal, round, and reactive to light.  Neck: Normal range of motion. Neck supple.  Cardiovascular: Regular rhythm, S1 normal and S2 normal.  Exam reveals no gallop and no friction rub.   No murmur heard. Pulmonary/Chest: Effort normal and breath sounds normal. No respiratory distress. He exhibits no tenderness.  Abdominal: Soft. Normal appearance and bowel sounds are normal. There is no hepatosplenomegaly. There is no tenderness. There is no rebound, no guarding, no tenderness at McBurney's point and negative Murphy's sign. No hernia.  Musculoskeletal: Normal range of motion.  Neurological: He is alert. He has normal strength. No cranial nerve deficit or sensory deficit. Coordination normal. GCS eye subscore is 4. GCS verbal subscore is 4. GCS motor subscore is 6.  Skin: Skin is warm and dry. Laceration noted. No rash noted. No cyanosis.     Psychiatric: He has a normal mood and affect. His speech is normal and behavior is normal. Thought content normal.    ED Course  Procedures (including critical care time)  LACERATION REPAIR Performed by: Orpah Greek. Authorized by: Orpah Greek Consent: Verbal consent obtained. Risks and benefits: risks, benefits and alternatives were discussed Consent given by: patient Patient identity confirmed: provided demographic data Prepped and Draped in normal sterile fashion Wound explored  Laceration Location: face  Laceration Length: 1.5cm  No Foreign Bodies seen or palpated  Anesthesia: local infiltration  Local anesthetic: lidocaine 1% with epinephrine  Anesthetic total: 1.5 ml  Irrigation method: syringe Amount of cleaning: standard  Skin closure: suture  Number of  sutures: 2   Technique: simple interrupted, 5-0 ethilon  Patient tolerance: Patient tolerated the procedure well with no immediate complications.   Labs Review Labs Reviewed  CBC WITH DIFFERENTIAL - Abnormal; Notable for the following:    RBC 3.90 (*)    Hemoglobin 12.9 (*)    HCT 37.3 (*)    Lymphs Abs 0.6 (*)    All other components within  normal limits  BASIC METABOLIC PANEL - Abnormal; Notable for the following:    Glucose, Bld 112 (*)    GFR calc non Af Amer 72 (*)    GFR calc Af Amer 84 (*)    All other components within normal limits  TROPONIN I   Imaging Review Dg Chest 1 View  09/24/2013   CLINICAL DATA:  Fall and complaining of pain.  EXAM: CHEST - 1 VIEW  COMPARISON:  10/15/2012  FINDINGS: Single view of the chest was obtained. Prominent lung markings appear chronic. No focal airspace disease. No evidence for a pneumothorax. Heart and mediastinum are stable. Thoracic aorta is mildly torturous.  IMPRESSION: Chronic lung changes without acute findings.   Electronically Signed   By: Markus Daft M.D.   On: 09/24/2013 21:36   Dg Thoracic Spine 2 View  09/24/2013   CLINICAL DATA:  Fall and complaining of pain.  EXAM: THORACIC SPINE - 2 VIEW  COMPARISON:  Chest 10/15/2012  FINDINGS: Two views of the chest demonstrate mild curvature in the thoracic spine. Degenerative endplate changes. The vertebral body heights are maintained. No evidence for an acute fracture.  IMPRESSION: No acute bone abnormality in the thoracic spine.   Electronically Signed   By: Markus Daft M.D.   On: 09/24/2013 21:35   Dg Lumbar Spine Complete  09/24/2013   CLINICAL DATA:  Fall and pain.  EXAM: LUMBAR SPINE - COMPLETE 4+ VIEW  COMPARISON:  06/14/2013  FINDINGS: Multiple calcifications in the right kidney region. Largest measures 7 mm. Surgical clips in the pelvis. Stable alignment of the lumbar spine. The vertebral body heights are maintained. Degenerative endplate changes. There is some gaseous distention of the  colon.  IMPRESSION: No acute bone abnormality in the lumbar spine.  Right kidney stones.   Electronically Signed   By: Markus Daft M.D.   On: 09/24/2013 21:41   Dg Hip Bilateral W/pelvis  09/24/2013   CLINICAL DATA:  Fall and complaining of pain.  EXAM: BILATERAL HIP WITH PELVIS - 4+ VIEW  COMPARISON:  None.  FINDINGS: Surgical clips in the pelvis. Pelvic bony ring is intact. Both hips are located without fracture. Mild degenerative changes in the hips.  IMPRESSION: No acute bone abnormality in the pelvis or hips.   Electronically Signed   By: Markus Daft M.D.   On: 09/24/2013 21:38   Ct Head Wo Contrast  09/24/2013   CLINICAL DATA:  Status post fall.  EXAM: CT HEAD WITHOUT CONTRAST  CT CERVICAL SPINE WITHOUT CONTRAST  TECHNIQUE: Multidetector CT imaging of the head and cervical spine was performed following the standard protocol without intravenous contrast. Multiplanar CT image reconstructions of the cervical spine were also generated.  COMPARISON:  None.  FINDINGS: CT HEAD FINDINGS  The brain is atrophic with chronic microvascular ischemic change. Scalp hematoma on the left is identified. There is no underlying fracture or foreign body. No evidence of acute intracranial abnormality including infarct, hemorrhage, mass lesion, mass effect, midline shift or abnormal extra-axial fluid collection is seen. There is no hydrocephalus or pneumocephalus.  CT CERVICAL SPINE FINDINGS  No fracture or malalignment of the cervical spine is identified. Multilevel cervical spondylosis is noted. Lung apices are clear.  IMPRESSION: Scalp hematoma on the left without underlying fracture or acute intracranial abnormality.  No acute abnormality cervical spine.  Atrophy and chronic microvascular ischemic change. Multilevel cervical spondylosis.   Electronically Signed   By: Inge Rise M.D.   On: 09/24/2013 21:52   Ct  Cervical Spine Wo Contrast  09/24/2013   CLINICAL DATA:  Status post fall.  EXAM: CT HEAD WITHOUT CONTRAST  CT  CERVICAL SPINE WITHOUT CONTRAST  TECHNIQUE: Multidetector CT imaging of the head and cervical spine was performed following the standard protocol without intravenous contrast. Multiplanar CT image reconstructions of the cervical spine were also generated.  COMPARISON:  None.  FINDINGS: CT HEAD FINDINGS  The brain is atrophic with chronic microvascular ischemic change. Scalp hematoma on the left is identified. There is no underlying fracture or foreign body. No evidence of acute intracranial abnormality including infarct, hemorrhage, mass lesion, mass effect, midline shift or abnormal extra-axial fluid collection is seen. There is no hydrocephalus or pneumocephalus.  CT CERVICAL SPINE FINDINGS  No fracture or malalignment of the cervical spine is identified. Multilevel cervical spondylosis is noted. Lung apices are clear.  IMPRESSION: Scalp hematoma on the left without underlying fracture or acute intracranial abnormality.  No acute abnormality cervical spine.  Atrophy and chronic microvascular ischemic change. Multilevel cervical spondylosis.   Electronically Signed   By: Inge Rise M.D.   On: 09/24/2013 21:52    EKG Interpretation   None       Date: 09/24/2013  Rate: 78   Rhythm: normal sinus rhythm and atrial fibrillation  QRS Axis: left  Intervals: PR prolonged  ST/T Wave abnormalities: nonspecific ST/T changes  Conduction Disutrbances:left anterior fascicular block  Narrative Interpretation:   Old EKG Reviewed: unchanged     MDM   Patient presents to the ER for evaluation after a fall. Fall was unwitnessed, but patient was awake when staff heard him fall and ran to where he was. Patient does have a laceration on his forehead with a contusion. He is demented, cannot give much information but did seem to indicate that he had some pain in his back. This might of been because she was lying on the spine board arrival, it improved after he was removed. There was no obvious abnormalities of  his back upon examination. CT scan of head and cervical spine were unremarkable. Thoracic spine, lumbar spine x-rays were negative, as were bilateral hips and pelvis. Patient will be returned to the nursing home. Sutures placed in the wound, need to be removed in 7 days.    Orpah Greek, MD 09/24/13 2350

## 2013-09-24 NOTE — ED Notes (Signed)
Dr. Betsey Holiday at bedside, two sutures applied to laceration.

## 2013-09-24 NOTE — ED Notes (Signed)
Pt returned from xray

## 2013-09-24 NOTE — ED Notes (Signed)
Pt transported to radiology.

## 2013-09-25 NOTE — ED Notes (Signed)
PTAR at bedside 

## 2013-09-26 ENCOUNTER — Encounter: Payer: Self-pay | Admitting: Nurse Practitioner

## 2013-09-26 ENCOUNTER — Non-Acute Institutional Stay (SKILLED_NURSING_FACILITY): Payer: Medicare Other | Admitting: Nurse Practitioner

## 2013-09-26 DIAGNOSIS — J45909 Unspecified asthma, uncomplicated: Secondary | ICD-10-CM

## 2013-09-26 DIAGNOSIS — F0390 Unspecified dementia without behavioral disturbance: Secondary | ICD-10-CM

## 2013-09-26 DIAGNOSIS — R627 Adult failure to thrive: Secondary | ICD-10-CM

## 2013-09-26 DIAGNOSIS — K219 Gastro-esophageal reflux disease without esophagitis: Secondary | ICD-10-CM

## 2013-09-26 DIAGNOSIS — R350 Frequency of micturition: Secondary | ICD-10-CM

## 2013-09-26 DIAGNOSIS — S0181XD Laceration without foreign body of other part of head, subsequent encounter: Secondary | ICD-10-CM

## 2013-09-26 DIAGNOSIS — Z5189 Encounter for other specified aftercare: Secondary | ICD-10-CM

## 2013-09-26 DIAGNOSIS — W19XXXA Unspecified fall, initial encounter: Secondary | ICD-10-CM

## 2013-09-26 DIAGNOSIS — W19XXXD Unspecified fall, subsequent encounter: Secondary | ICD-10-CM

## 2013-09-26 DIAGNOSIS — F32A Depression, unspecified: Secondary | ICD-10-CM

## 2013-09-26 DIAGNOSIS — K5900 Constipation, unspecified: Secondary | ICD-10-CM

## 2013-09-26 DIAGNOSIS — F3289 Other specified depressive episodes: Secondary | ICD-10-CM

## 2013-09-26 DIAGNOSIS — F329 Major depressive disorder, single episode, unspecified: Secondary | ICD-10-CM

## 2013-09-26 DIAGNOSIS — S0181XA Laceration without foreign body of other part of head, initial encounter: Secondary | ICD-10-CM | POA: Insufficient documentation

## 2013-09-26 NOTE — Assessment & Plan Note (Signed)
Discontinue non essential medications per POA's request: dc'd Vit B12, Vit D, Cal.

## 2013-09-26 NOTE — Assessment & Plan Note (Signed)
Relapsed since the patient admitted to [facility] Care Unit-difficulty adjusting, outburst, angry, agitated at times--05/23/13 started Celexa 10mg  daily and observe the patient-[id number]--may consider to dc it if he keeps falling. EKG 06/12/13 showed regular RR, PR 204, QT 420. No acute ST-T. Stabilized.

## 2013-09-26 NOTE — Assessment & Plan Note (Signed)
Stable, not on PPI, takes Zofran prn.    

## 2013-09-26 NOTE — Assessment & Plan Note (Signed)
Takes Tamisulosin and Finasteride. No urinary retention.    

## 2013-09-26 NOTE — Assessment & Plan Note (Signed)
Stable, off  Docusate 200mg  po daily and continued Senokot S I daily  and Polyethylene daily prn.

## 2013-09-26 NOTE — Assessment & Plan Note (Signed)
Stable , no longer able to utilize [employer] effectively-will dc--may consider Neb if needed.

## 2013-09-26 NOTE — Progress Notes (Signed)
Patient ID: Nathan Andrade., male   DOB: 1915-11-18, 80 y.o.   MRN: [id number]   Code Status: DNR  Allergies  Allergen Reactions  . Sulfa Antibiotics     As noted on [facility]    Chief Complaint  Patient presents with  . Medical Managment of Chronic Issues    s/p fall, scalp laceration.   . Acute Visit    HPI: Patient is a 80 y.o. male seen in the SNF at [facility] today for evaluation of s/p fall, left forehead laceration with suture closure x2-to be removed 10/01/12,  and other chronic medical conditions. 09/24/12 the patient was found him  lying on the ground with a laceration on his forehead. He was awake. Patient has a history of severe dementia, is at his normal baseline according to staff and EMS. EMS reports that he was agitated and somewhat combative during transport, but this is his normal baseline. Patient cannot give any information arrival due to his chronic dementia.  Problem List Items Addressed This Visit   Dementia - Primary      unspecified, without behavioral disturbance,  stopped Exelon and Namenda, transferred out  Memory Care Unit, Ativan 1mg  q4hr prn for agitation. Lack of safety awareness and increased frailty contribute to his falling.                     Fall     Multiple falls due to no safety awareness and increased frailty. Last fall 09/24/13 and resulted in left forehead laceration that required suture closures x2.      Asthma, chronic     Stable , no longer able to utilize [employer] effectively-will dc--may consider Neb if needed.                   Depression     Relapsed since the patient admitted to Memory Care Unit-difficulty adjusting, outburst, angry, agitated at times--05/23/13 started Celexa 10mg  daily and observe the patient-fellx2--may consider to dc it if he keeps falling. EKG 06/12/13 showed regular RR, PR 204, QT 420. No acute ST-T. Stabilized.                     GERD (gastroesophageal reflux  disease)     Stable, not on PPI, takes Zofran prn.             FTT (failure to thrive) in adult     Discontinue non essential medications per POA's request: dc'd Vit B12, Vit D, Cal.       Unspecified constipation     Stable, off  Docusate 200mg  po daily and continued Senokot S I daily  and Polyethylene daily prn.                 Urinary frequency     Takes Tamisulosin and Finasteride. No urinary retention.                 Laceration of left side of forehead with complication     Suture closures x2 left forehead-to be removed in 7 days-10/01/13. Observe the patient.        Review of Systems:  Review of Systems  Constitutional: Positive for weight loss. Negative for fever, chills, malaise/fatigue and diaphoresis.  HENT: Positive for hearing loss. Negative for congestion, ear pain and sore throat.   Eyes: Negative for pain, discharge and redness.  Respiratory: Negative for cough, sputum production, shortness of breath and wheezing.   Cardiovascular: Negative for chest  pain, palpitations, orthopnea, claudication and leg swelling.  Gastrointestinal: Negative for heartburn, nausea, vomiting, abdominal pain, diarrhea, constipation and blood in stool.  Genitourinary: Positive for frequency. Negative for dysuria, urgency, hematuria and flank pain.  Musculoskeletal: Positive for falls and joint pain. Negative for back pain, myalgias and neck pain.       The right shoulder pain with over head ROM  Skin: Negative for itching and rash.       The left forehead laceration.   Neurological: Negative for dizziness, tingling, tremors, sensory change, speech change, focal weakness, seizures, loss of consciousness, weakness and headaches.  Endo/Heme/Allergies: Negative for environmental allergies and polydipsia. Does not bruise/bleed easily.  Psychiatric/Behavioral: Positive for memory loss. Negative for depression and hallucinations. The patient is not nervous/anxious  and does not have insomnia.      Past Medical History  Diagnosis Date  . Asthma   . Melanoma   . Prostatic adenocarcinoma   . Dementia, unspecified, without behavioral disturbance   . Glaucoma   . Fibromatosis, plantar   . Malignant neoplasm of prostate   . Malignant melanoma of skin of scalp and neck   . Disorder of bone and cartilage, unspecified   . Personal history of other malignant neoplasm of skin   . Personal history of malignant melanoma of skin   . Glaucoma   . Diverticulosis   . Colon polyps   . Vitamin B 12 deficiency   . Vitamin D deficiency   . Insomnia   . Nephrolithiasis   . Urine incontinence   . History of TIA (transient ischemic attack)   . Eczema    Past Surgical History  Procedure Laterality Date  . Prostatectomy    . Inguinal hernia repair      right (mesh)  . Cataract extraction w/ intraocular lens  implant, bilateral Bilateral   . Kidney stone surgery  09/2009   Social History:   reports that he quit smoking about 74 years ago. His smoking use included Pipe. He has never used smokeless tobacco. He reports that he drinks alcohol. He reports that he does not use illicit drugs.  Family History  Problem Relation Age of Onset  . Heart disease Mother   . Emphysema Father     Medications: Patient's Medications  New Prescriptions   No medications on file  Previous Medications   ASPIRIN EC 81 MG TABLET    Take 81 mg by mouth daily.   CITALOPRAM (CELEXA) 10 MG TABLET    Take 20 mg by mouth daily.    FINASTERIDE (PROSCAR) 5 MG TABLET    Take 5 mg by mouth daily. *Do Not Crush* Wear gloves when handling to avoid exposure.   HYDROCODONE-ACETAMINOPHEN (NORCO/VICODIN) 5-325 MG PER TABLET    every 6 (six) hours as needed. Take 1/2 tablet three times a day   LATANOPROST (XALATAN) 0.005 % OPHTHALMIC SOLUTION    Place 1 drop into both eyes at bedtime. Wait 3-5 minutes between eye drops   LORATADINE (CLARITIN) 10 MG TABLET    Take 10 mg by mouth daily.    ONDANSETRON (ZOFRAN) 4 MG TABLET    Take 4 mg by mouth every 6 (six) hours. Every 6 hours as needed for nausea.   POLYETHYLENE GLYCOL (MIRALAX / GLYCOLAX) PACKET    Take 17 g by mouth daily as needed. Fill cap to 17 gm mark, mix with 4-6 ounces of water and take by mouth every day as needed for constipation.   SENNA-DOCUSATE (SENOKOT S) 8.6-50  MG PER TABLET    Take 1 tablet by mouth daily.    TAMSULOSIN (FLOMAX) 0.4 MG CAPS    Take 0.4 mg by mouth daily. *Do not crush* * Do not open capsule** May discharge if patient passes stone.*  Modified Medications   No medications on file  Discontinued Medications   BUDESONIDE-FORMOTEROL (SYMBICORT) 160-4.5 MCG/ACT INHALER    Inhale 2 puffs into the lungs daily. Wait 1 minute between puffs, rinse mouth after use     Physical Exam: Physical Exam  Constitutional: He is oriented to person, place, and time. He appears well-developed and well-nourished.  HENT:  Head: Normocephalic and atraumatic.  Eyes: Conjunctivae and EOM are normal. Pupils are equal, round, and reactive to light.  Neck: Normal range of motion. Neck supple. No JVD present. No thyromegaly present.  Cardiovascular: Normal rate, regular rhythm and normal heart sounds.   No murmur heard. Pulmonary/Chest: Effort normal. He has decreased breath sounds. He has no wheezes. He has no rales. He exhibits no tenderness.  Abdominal: Soft. Bowel sounds are normal. There is no tenderness.  Musculoskeletal: Normal range of motion. He exhibits no edema.  The right shoulder pain with over head ROM  Lymphadenopathy:    He has no cervical adenopathy.  Neurological: He is alert and oriented to person, place, and time. He has normal reflexes. No cranial nerve deficit. He exhibits normal muscle tone. Coordination normal.  Skin: Skin is warm and dry. No rash noted. No erythema.  The left forehead laceration with suture closures x2  Psychiatric: His mood appears not anxious. His affect is not angry, not  blunt, not labile and not inappropriate. His speech is not rapid and/or pressured, not delayed, not tangential and not slurred. He is slowed. He is not agitated, not aggressive, not hyperactive, not withdrawn, not actively hallucinating and not combative. Thought content is not paranoid and not delusional. Cognition and memory are impaired. He does not express impulsivity or inappropriate judgment. He does not exhibit a depressed mood. He exhibits abnormal recent memory.    Filed Vitals:   09/26/13 1700  BP: 112/63  Pulse: 64  Temp: 98.6 F (37 C)  TempSrc: Tympanic  Resp: 18      Labs reviewed: Basic Metabolic Panel:  Recent Labs  10/15/12 2130 10/15/12 2345 12/19/12 03/27/13 05/26/13 09/24/13 2031  NA 137  --   --  140 137 138  K 5.7* 4.5  --  3.9 3.8 3.7  CL 102  --   --   --   --  100  CO2 27  --   --   --   --  29  GLUCOSE 129*  --   --   --   --  112*  BUN 21  --   --  18 16 17   CREATININE 1.07  --   --  1.1 0.9 0.83  CALCIUM 10.4  --   --   --   --  9.9  TSH  --   --  1.70  --   --   --    Liver Function Tests:  Recent Labs  10/15/12 2130 03/27/13 05/26/13  AST 32 18 16  ALT 12 8* 8*  ALKPHOS 59 51 57  BILITOT 0.4  --   --   PROT 6.7  --   --   ALBUMIN 3.4*  --   --     Recent Labs  10/15/12 2130  LIPASE 22   CBC:  Recent Labs  10/15/12 2130 12/19/12 05/26/13 09/24/13 2031  WBC 9.4 6.8 7.7 5.3  NEUTROABS 7.9*  --   --  3.9  HGB 13.5 12.2* 12.5* 12.9*  HCT 38.4* 36* 36* 37.3*  MCV 96.0  --   --  95.6  PLT 219 217 225 211    Past Procedures:  06/07/13 X-ray R clavicle, humerus, shoulder: no fracture or dislocation seen. Mild degenerative change.   06/12/13 EKG: regular RR, PR 204, QT 420.   Dg Chest 1 View  09/24/2013 CLINICAL DATA: Fall and complaining of pain. EXAM: CHEST - 1 VIEW  IMPRESSION: Chronic lung changes without acute findings.   Dg Thoracic Spine 2 View  09/24/2013 CLINICAL DATA: Fall and complaining of pain. EXAM: THORACIC SPINE  - 2 VIEW IMPRESSION: No acute bone abnormality in the thoracic spine.   Dg Lumbar Spine Complete  09/24/2013 CLINICAL DATA: Fall and pain. EXAM: LUMBAR SPINE - COMPLETE 4+ VIEW IMPRESSION: No acute bone abnormality in the lumbar spine. Right kidney stones.   Dg Hip Bilateral W/pelvis  09/24/2013 CLINICAL DATA: Fall and complaining of pain. EXAM: BILATERAL HIP WITH PELVIS - 4+ VIEW COMPARISON: IMPRESSION: No acute bone abnormality in the pelvis or hips.   Ct Head Wo Contrast  09/24/2013 CLINICAL DATA: Status post fall. EXAM: CT HEAD WITHOUT CONTRAST CT CERVICAL SPINE WITHOUT CONTRAST IMPRESSION: Scalp hematoma on the left without underlying fracture or acute intracranial abnormality. No acute abnormality cervical spine. Atrophy and chronic microvascular ischemic change. Multilevel cervical spondylosis.   Ct Cervical Spine Wo Contrast  09/24/2013 CLINICAL DATA: Status post fall. EXAM: CT HEAD WITHOUT CONTRAST CT CERVICAL SPINE WITHOUT CONTRAST IMPRESSION: Scalp hematoma on the left without underlying fracture or acute intracranial abnormality. No acute abnormality cervical spine. Atrophy and chronic microvascular ischemic change. Multilevel cervical spondylosis.   Assessment/Plan Dementia  unspecified, without behavioral disturbance,  stopped Exelon and Namenda, transferred out  Memory Care Unit, Ativan 1mg  q4hr prn for agitation. Lack of safety awareness and increased frailty contribute to his falling.                   Fall Multiple falls due to no safety awareness and increased frailty. Last fall 09/24/13 and resulted in left forehead laceration that required suture closures x2.    Asthma, chronic Stable , no longer able to utilize [employer] effectively-will dc--may consider Neb if needed.                 Depression Relapsed since the patient admitted to Memory Care Unit-difficulty adjusting, outburst, angry, agitated at times--05/23/13 started Celexa 10mg  daily and  observe the patient-fellx2--may consider to dc it if he keeps falling. EKG 06/12/13 showed regular RR, PR 204, QT 420. No acute ST-T. Stabilized.                   GERD (gastroesophageal reflux disease) Stable, not on PPI, takes Zofran prn.           FTT (failure to thrive) in adult Discontinue non essential medications per POA's request: dc'd Vit B12, Vit D, Cal.     Unspecified constipation Stable, off  Docusate 200mg  po daily and continued Senokot S I daily  and Polyethylene daily prn.               Urinary frequency Takes Tamisulosin and Finasteride. No urinary retention.               Laceration of left side of forehead with complication Suture closures x2 left forehead-to  be removed in 7 days-10/01/13. Observe the patient.     Family/ Staff Communication: observe the patient  Goals of Care: SNF  Labs/tests ordered: none

## 2013-09-26 NOTE — Assessment & Plan Note (Signed)
unspecified, without behavioral disturbance,  stopped Exelon and Namenda, transferred out  Memory Care Unit, Ativan 1mg  q4hr prn for agitation. Lack of safety awareness and increased frailty contribute to his falling.

## 2013-09-26 NOTE — Assessment & Plan Note (Signed)
Multiple falls due to no safety awareness and increased frailty. Last fall 09/24/13 and resulted in left forehead laceration that required suture closures x2.

## 2013-09-26 NOTE — Assessment & Plan Note (Signed)
Suture closures x2 left forehead-to be removed in 7 days-10/01/13. Observe the patient.

## 2013-11-09 ENCOUNTER — Other Ambulatory Visit: Payer: Self-pay | Admitting: *Deleted

## 2013-11-09 MED ORDER — HYDROCODONE-ACETAMINOPHEN 5-325 MG PO TABS: ORAL_TABLET | ORAL | Status: DC

## 2013-11-09 NOTE — Telephone Encounter (Signed)
[facility] 

## 2013-11-12 ENCOUNTER — Non-Acute Institutional Stay (SKILLED_NURSING_FACILITY): Payer: Medicare Other | Admitting: Nurse Practitioner

## 2013-11-12 ENCOUNTER — Encounter: Payer: Self-pay | Admitting: Nurse Practitioner

## 2013-11-12 DIAGNOSIS — K219 Gastro-esophageal reflux disease without esophagitis: Secondary | ICD-10-CM

## 2013-11-12 DIAGNOSIS — R350 Frequency of micturition: Secondary | ICD-10-CM

## 2013-11-12 DIAGNOSIS — F32A Depression, unspecified: Secondary | ICD-10-CM

## 2013-11-12 DIAGNOSIS — J45909 Unspecified asthma, uncomplicated: Secondary | ICD-10-CM

## 2013-11-12 DIAGNOSIS — R609 Edema, unspecified: Secondary | ICD-10-CM

## 2013-11-12 DIAGNOSIS — F329 Major depressive disorder, single episode, unspecified: Secondary | ICD-10-CM

## 2013-11-12 DIAGNOSIS — M1990 Unspecified osteoarthritis, unspecified site: Secondary | ICD-10-CM

## 2013-11-12 DIAGNOSIS — K5900 Constipation, unspecified: Secondary | ICD-10-CM

## 2013-11-12 DIAGNOSIS — F0390 Unspecified dementia without behavioral disturbance: Secondary | ICD-10-CM

## 2013-11-12 DIAGNOSIS — R627 Adult failure to thrive: Secondary | ICD-10-CM

## 2013-11-12 DIAGNOSIS — F3289 Other specified depressive episodes: Secondary | ICD-10-CM

## 2013-11-12 NOTE — Assessment & Plan Note (Signed)
Relapsed since the patient admitted to Memory Care Unit-difficulty adjusting, outburst, angry, agitated at times--05/23/13 started Celexa 10mg daily and observe the patient-fellx2--may consider to dc it if he keeps falling. EKG 06/12/13 showed regular RR, PR 204, QT 420. No acute ST-T. Stabilized.                  

## 2013-11-12 NOTE — Assessment & Plan Note (Signed)
Stable, off  Docusate 200mg  po daily, continued Senokot S I daily  and Polyethylene daily prn.

## 2013-11-12 NOTE — Assessment & Plan Note (Signed)
Discontinue non essential medications per POA's request: off Vit B12, Vit D, Cal.

## 2013-11-12 NOTE — Assessment & Plan Note (Signed)
Stable on daily Synbicort

## 2013-11-12 NOTE — Assessment & Plan Note (Signed)
Takes Tamisulosin and Finasteride. No urinary retention.

## 2013-11-12 NOTE — Assessment & Plan Note (Signed)
BLE 1+, mainly in his ankles previously-resolved now.

## 2013-11-12 NOTE — Assessment & Plan Note (Signed)
unspecified, combative when assisted with personal care-mostly in pm,  stopped Exelon and Namenda, transferred out  Memory Care Unit, Ativan 1mg  q4hr prn for agitation. Lack of safety awareness and increased frailty contribute to his falling.

## 2013-11-12 NOTE — Progress Notes (Signed)
Patient ID: Nathan Andrade., male   DOB: [birth year], 80 y.o.   MRN: [id number]   Code Status: DNR  Allergies  Allergen Reactions  . Sulfa Antibiotics     As noted on [facility]    Chief Complaint  Patient presents with  . Medical Managment of Chronic Issues    HPI: Patient is a 80 y.o. male seen in the SNF at [facility] today for evaluation of chronic medical conditions.   09/24/12 the patient was found him  lying on the ground with a laceration on his forehead. He was awake. Patient has a history of severe dementia, is at his normal baseline according to staff and EMS. EMS reports that he was agitated and somewhat combative during transport, but this is his normal baseline. Patient cannot give any information arrival due to his chronic dementia.  Problem List Items Addressed This Visit   Urinary frequency     Takes Tamisulosin and Finasteride. No urinary retention.       Unspecified constipation     Stable, off  Docusate 200mg  po daily, continued Senokot S I daily  and Polyethylene daily prn.       Osteoarthritis     Multiple sites, foot, shoulder, and legs--takes Norco 1/2 tid      GERD (gastroesophageal reflux disease)     Stable, not on PPI, takes Zofran prn.       FTT (failure to thrive) in adult     Discontinue non essential medications per POA's request: off Vit B12, Vit D, Cal.      Edema     BLE 1+, mainly in his ankles previously-resolved now.      Depression     Relapsed since the patient admitted to Memory Care Unit-difficulty adjusting, outburst, angry, agitated at times--05/23/13 started Celexa 10mg  daily and observe the patient-fellx2--may consider to dc it if he keeps falling. EKG 06/12/13 showed regular RR, PR 204, QT 420. No acute ST-T. Stabilized.      Dementia - Primary     unspecified, combative when assisted with personal care-mostly in pm,  stopped Exelon and Namenda, transferred out  Memory Care Unit, Ativan 1mg  q4hr prn for agitation. Lack  of safety awareness and increased frailty contribute to his falling.       Asthma, chronic     Stable on daily Synbicort         Review of Systems:  Review of Systems  Constitutional: Positive for weight loss. Negative for fever, chills, malaise/fatigue and diaphoresis.  HENT: Positive for hearing loss. Negative for congestion, ear pain and sore throat.   Eyes: Negative for pain, discharge and redness.  Respiratory: Negative for cough, sputum production, shortness of breath and wheezing.   Cardiovascular: Negative for chest pain, palpitations, orthopnea, claudication and leg swelling.  Gastrointestinal: Negative for heartburn, nausea, vomiting, abdominal pain, diarrhea, constipation and blood in stool.  Genitourinary: Positive for frequency. Negative for dysuria, urgency, hematuria and flank pain.  Musculoskeletal: Positive for falls and joint pain. Negative for back pain, myalgias and neck pain.       The right shoulder pain with over head ROM  Skin: Negative for itching and rash.  Neurological: Negative for dizziness, tingling, tremors, sensory change, speech change, focal weakness, seizures, loss of consciousness, weakness and headaches.  Endo/Heme/Allergies: Negative for environmental allergies and polydipsia. Does not bruise/bleed easily.  Psychiatric/Behavioral: Positive for memory loss. Negative for depression and hallucinations. The patient is not nervous/anxious and does not have insomnia.  Agitation and combative when he is assisted with personal care especially in pm.      Past Medical History  Diagnosis Date  . Asthma   . Melanoma   . Prostatic adenocarcinoma   . Dementia, unspecified, without behavioral disturbance   . Glaucoma   . Fibromatosis, plantar   . Malignant neoplasm of prostate   . Malignant melanoma of skin of scalp and neck   . Disorder of bone and cartilage, unspecified   . Personal history of other malignant neoplasm of skin   . Personal  history of malignant melanoma of skin   . Glaucoma   . Diverticulosis   . Colon polyps   . Vitamin B 12 deficiency   . Vitamin D deficiency   . Insomnia   . Nephrolithiasis   . Urine incontinence   . History of TIA (transient ischemic attack)   . Eczema    Past Surgical History  Procedure Laterality Date  . Prostatectomy    . Inguinal hernia repair      right (mesh)  . Cataract extraction w/ intraocular lens  implant, bilateral Bilateral   . Kidney stone surgery  09/2009   Social History:   reports that he quit smoking about 74 years ago. His smoking use included Pipe. He has never used smokeless tobacco. He reports that he drinks alcohol. He reports that he does not use illicit drugs.  Family History  Problem Relation Age of Onset  . Heart disease Mother   . Emphysema Father     Medications: Patient's Medications  New Prescriptions   No medications on file  Previous Medications   ASPIRIN EC 81 MG TABLET    Take 81 mg by mouth daily.   CITALOPRAM (CELEXA) 10 MG TABLET    Take 20 mg by mouth daily.    FINASTERIDE (PROSCAR) 5 MG TABLET    Take 5 mg by mouth daily. *Do Not Crush* Wear gloves when handling to avoid exposure.   HYDROCODONE-ACETAMINOPHEN (NORCO/VICODIN) 5-325 MG PER TABLET    Take 1/2 tablet by mouth three times daily for pain   LATANOPROST (XALATAN) 0.005 % OPHTHALMIC SOLUTION    Place 1 drop into both eyes at bedtime. Wait 3-5 minutes between eye drops   LORATADINE (CLARITIN) 10 MG TABLET    Take 10 mg by mouth daily.   ONDANSETRON (ZOFRAN) 4 MG TABLET    Take 4 mg by mouth every 6 (six) hours. Every 6 hours as needed for nausea.   POLYETHYLENE GLYCOL (MIRALAX / GLYCOLAX) PACKET    Take 17 g by mouth daily as needed. Fill cap to 17 gm mark, mix with 4-6 ounces of water and take by mouth every day as needed for constipation.   SENNA-DOCUSATE (SENOKOT S) 8.6-50 MG PER TABLET    Take 1 tablet by mouth daily.    TAMSULOSIN (FLOMAX) 0.4 MG CAPS    Take 0.4 mg by  mouth daily. *Do not crush* * Do not open capsule** May discharge if patient passes stone.*  Modified Medications   No medications on file  Discontinued Medications   No medications on file     Physical Exam: Physical Exam  Constitutional: He is oriented to person, place, and time. He appears well-developed and well-nourished.  HENT:  Head: Normocephalic and atraumatic.  Eyes: Conjunctivae and EOM are normal. Pupils are equal, round, and reactive to light.  Neck: Normal range of motion. Neck supple. No JVD present. No thyromegaly present.  Cardiovascular: Normal rate, regular rhythm  and normal heart sounds.   No murmur heard. Pulmonary/Chest: Effort normal. He has decreased breath sounds. He has no wheezes. He has no rales. He exhibits no tenderness.  Abdominal: Soft. Bowel sounds are normal. There is no tenderness.  Musculoskeletal: Normal range of motion. He exhibits no edema.  The right shoulder pain with over head ROM  Lymphadenopathy:    He has no cervical adenopathy.  Neurological: He is alert and oriented to person, place, and time. He has normal reflexes. No cranial nerve deficit. He exhibits normal muscle tone. Coordination normal.  Skin: Skin is warm and dry. No rash noted. No erythema.  Psychiatric: His mood appears not anxious. His affect is not angry, not blunt, not labile and not inappropriate. His speech is not rapid and/or pressured, not delayed, not tangential and not slurred. He is agitated, slowed and combative. He is not aggressive, not hyperactive, not withdrawn and not actively hallucinating. Thought content is not paranoid and not delusional. Cognition and memory are impaired. He expresses impulsivity and inappropriate judgment. He does not exhibit a depressed mood. He exhibits abnormal recent memory.    Filed Vitals:   11/12/13 1705  BP: 124/46  Pulse: 73  Temp: 97.9 F (36.6 C)  TempSrc: Tympanic  Resp: 20      Labs reviewed: Basic Metabolic  Panel:  Recent Labs  12/19/12  05/26/13 09/24/13 09/24/13 2031  NA  --   < > 137 138 138  K  --   < > 3.8 3.7 3.7  CL  --   --   --   --  100  CO2  --   --   --   --  29  GLUCOSE  --   --   --   --  112*  BUN  --   < > 16 17 17   CREATININE  --   < > 0.9 0.8 0.83  CALCIUM  --   --   --   --  9.9  TSH 1.70  --   --   --   --   < > = values in this interval not displayed. Liver Function Tests:  Recent Labs  03/27/13 05/26/13  AST 18 16  ALT 8* 8*  ALKPHOS 51 57   CBC:  Recent Labs  12/19/12 05/26/13 09/24/13 09/24/13 2031  WBC 6.8 7.7 5.3 5.3  NEUTROABS  --   --   --  3.9  HGB 12.2* 12.5*  --  12.9*  HCT 36* 36*  --  37.3*  MCV  --   --   --  95.6  PLT 217 225  --  211    Past Procedures:  06/07/13 X-ray R clavicle, humerus, shoulder: no fracture or dislocation seen. Mild degenerative change.   06/12/13 EKG: regular RR, PR 204, QT 420.   Dg Chest 1 View  09/24/2013 CLINICAL DATA: Fall and complaining of pain. EXAM: CHEST - 1 VIEW  IMPRESSION: Chronic lung changes without acute findings.   Dg Thoracic Spine 2 View  09/24/2013 CLINICAL DATA: Fall and complaining of pain. EXAM: THORACIC SPINE - 2 VIEW IMPRESSION: No acute bone abnormality in the thoracic spine.   Dg Lumbar Spine Complete  09/24/2013 CLINICAL DATA: Fall and pain. EXAM: LUMBAR SPINE - COMPLETE 4+ VIEW IMPRESSION: No acute bone abnormality in the lumbar spine. Right kidney stones.   Dg Hip Bilateral W/pelvis  09/24/2013 CLINICAL DATA: Fall and complaining of pain. EXAM: BILATERAL HIP WITH PELVIS - 4+ VIEW COMPARISON: IMPRESSION:  No acute bone abnormality in the pelvis or hips.   Ct Head Wo Contrast  09/24/2013 CLINICAL DATA: Status post fall. EXAM: CT HEAD WITHOUT CONTRAST CT CERVICAL SPINE WITHOUT CONTRAST IMPRESSION: Scalp hematoma on the left without underlying fracture or acute intracranial abnormality. No acute abnormality cervical spine. Atrophy and chronic microvascular ischemic change. Multilevel cervical  spondylosis.   Ct Cervical Spine Wo Contrast  09/24/2013 CLINICAL DATA: Status post fall. EXAM: CT HEAD WITHOUT CONTRAST CT CERVICAL SPINE WITHOUT CONTRAST IMPRESSION: Scalp hematoma on the left without underlying fracture or acute intracranial abnormality. No acute abnormality cervical spine. Atrophy and chronic microvascular ischemic change. Multilevel cervical spondylosis.   Assessment/Plan Dementia unspecified, combative when assisted with personal care-mostly in pm,  stopped Exelon and Namenda, transferred out  Memory Care Unit, Ativan 1mg  q4hr prn for agitation. Lack of safety awareness and increased frailty contribute to his falling.     Asthma, chronic Stable on daily Synbicort    Depression Relapsed since the patient admitted to Memory Care Unit-difficulty adjusting, outburst, angry, agitated at times--05/23/13 started Celexa 10mg  daily and observe the patient-fellx2--may consider to dc it if he keeps falling. EKG 06/12/13 showed regular RR, PR 204, QT 420. No acute ST-T. Stabilized.    GERD (gastroesophageal reflux disease) Stable, not on PPI, takes Zofran prn.     FTT (failure to thrive) in adult Discontinue non essential medications per POA's request: off Vit B12, Vit D, Cal.    Unspecified constipation Stable, off  Docusate 200mg  po daily, continued Senokot S I daily  and Polyethylene daily prn.     Osteoarthritis Multiple sites, foot, shoulder, and legs--takes Norco 1/2 tid    Edema BLE 1+, mainly in his ankles previously-resolved now.    Urinary frequency Takes Tamisulosin and Finasteride. No urinary retention.       Family/ Staff Communication: observe the patient  Goals of Care: SNF  Labs/tests ordered: none

## 2013-11-12 NOTE — Assessment & Plan Note (Signed)
Stable, not on PPI, takes Zofran prn.

## 2013-11-12 NOTE — Assessment & Plan Note (Signed)
Multiple sites, foot, shoulder, and legs--takes Norco 1/2 tid

## 2013-12-10 ENCOUNTER — Non-Acute Institutional Stay (SKILLED_NURSING_FACILITY): Payer: Medicare Other | Admitting: Nurse Practitioner

## 2013-12-10 DIAGNOSIS — J45909 Unspecified asthma, uncomplicated: Secondary | ICD-10-CM

## 2013-12-10 DIAGNOSIS — R350 Frequency of micturition: Secondary | ICD-10-CM

## 2013-12-10 DIAGNOSIS — F32A Depression, unspecified: Secondary | ICD-10-CM

## 2013-12-10 DIAGNOSIS — F329 Major depressive disorder, single episode, unspecified: Secondary | ICD-10-CM

## 2013-12-10 DIAGNOSIS — R627 Adult failure to thrive: Secondary | ICD-10-CM

## 2013-12-10 DIAGNOSIS — K219 Gastro-esophageal reflux disease without esophagitis: Secondary | ICD-10-CM

## 2013-12-10 DIAGNOSIS — M1990 Unspecified osteoarthritis, unspecified site: Secondary | ICD-10-CM

## 2013-12-10 DIAGNOSIS — F3289 Other specified depressive episodes: Secondary | ICD-10-CM

## 2013-12-10 DIAGNOSIS — K5900 Constipation, unspecified: Secondary | ICD-10-CM

## 2013-12-10 DIAGNOSIS — F0390 Unspecified dementia without behavioral disturbance: Secondary | ICD-10-CM

## 2013-12-10 NOTE — Assessment & Plan Note (Signed)
Well managed with Morphine 5mg  tid and q2h prn

## 2013-12-10 NOTE — Assessment & Plan Note (Signed)
No change since off Flomax and Proscar.

## 2013-12-10 NOTE — Assessment & Plan Note (Signed)
unspecified, combative when assisted with personal care-less now due to increased frailty.  Stopped Exelon and Namenda, transferred out  Memory Care Unit. Lack of safety awareness and increased frailty contribute to his falling.

## 2013-12-10 NOTE — Assessment & Plan Note (Signed)
Prn daily MiraLax

## 2013-12-10 NOTE — Assessment & Plan Note (Signed)
Most of time he is stupor-dc Celexa.

## 2013-12-10 NOTE — Assessment & Plan Note (Signed)
Persisted.

## 2013-12-10 NOTE — Assessment & Plan Note (Signed)
Stable, off Synbicort

## 2013-12-10 NOTE — Assessment & Plan Note (Signed)
Vomited 12/05/13 and 12/06/13-better since prn Zofran available to him and Norco was switched to Morphine

## 2013-12-10 NOTE — Progress Notes (Signed)
Patient ID: Nathan Cummins., male   DOB: [birth year], 80 y.o.   MRN: [id number]   Code Status: DNR  Allergies  Allergen Reactions  . Sulfa Antibiotics     As noted on [facility]    Chief Complaint  Patient presents with  . Medical Managment of Chronic Issues  . Acute Visit    vomiting    HPI: Patient is a 80 y.o. male seen in the SNF at [facility] today for evaluation of vomiting and chronic medical conditions.   09/24/12 the patient was found him  lying on the ground with a laceration on his forehead. He was awake. Patient has a history of severe dementia, is at his normal baseline according to staff and EMS. EMS reports that he was agitated and somewhat combative during transport, but this is his normal baseline. Patient cannot give any information arrival due to his chronic dementia.  Problem List Items Addressed This Visit   Dementia - Primary     unspecified, combative when assisted with personal care-less now due to increased frailty.  Stopped Exelon and Namenda, transferred out  Memory Care Unit. Lack of safety awareness and increased frailty contribute to his falling.       Asthma, chronic     Stable, off Synbicort     Depression     Most of time he is stupor-dc Celexa.        GERD (gastroesophageal reflux disease)     Vomited 12/05/13 and 12/06/13-better since prn Zofran available to him and Norco was switched to Morphine     FTT (failure to thrive) in adult     Persisted.     Unspecified constipation     Prn daily MiraLax    Urinary frequency     No change since off Flomax and Proscar.     Osteoarthritis     Well managed with Morphine 5mg  tid and q2h prn     Relevant Medications      morphine (ROXANOL) 20 MG/ML concentrated solution      Review of Systems:  Review of Systems  Constitutional: Positive for weight loss. Negative for fever, chills, malaise/fatigue and diaphoresis.  HENT: Positive for hearing loss. Negative for congestion, ear pain and  sore throat.   Eyes: Negative for pain, discharge and redness.  Respiratory: Negative for cough, sputum production, shortness of breath and wheezing.   Cardiovascular: Negative for chest pain, palpitations, orthopnea, claudication and leg swelling.  Gastrointestinal: Negative for heartburn, nausea, vomiting, abdominal pain, diarrhea, constipation and blood in stool.  Genitourinary: Positive for frequency. Negative for dysuria, urgency, hematuria and flank pain.  Musculoskeletal: Positive for falls and joint pain. Negative for back pain, myalgias and neck pain.       The right shoulder pain with over head ROM  Skin: Negative for itching and rash.  Neurological: Negative for dizziness, tingling, tremors, sensory change, speech change, focal weakness, seizures, loss of consciousness, weakness and headaches.  Endo/Heme/Allergies: Negative for environmental allergies and polydipsia. Does not bruise/bleed easily.  Psychiatric/Behavioral: Positive for memory loss. Negative for depression and hallucinations. The patient is not nervous/anxious and does not have insomnia.        Agitation and combative when he is assisted with personal care.     Past Medical History  Diagnosis Date  . Asthma   . Melanoma   . Prostatic adenocarcinoma   . Dementia, unspecified, without behavioral disturbance   . Glaucoma   . Fibromatosis, plantar   . Malignant neoplasm  of prostate   . Malignant melanoma of skin of scalp and neck   . Disorder of bone and cartilage, unspecified   . Personal history of other malignant neoplasm of skin   . Personal history of malignant melanoma of skin   . Glaucoma   . Diverticulosis   . Colon polyps   . Vitamin B 12 deficiency   . Vitamin D deficiency   . Insomnia   . Nephrolithiasis   . Urine incontinence   . History of TIA (transient ischemic attack)   . Eczema    Past Surgical History  Procedure Laterality Date  . Prostatectomy    . Inguinal hernia repair      right  (mesh)  . Cataract extraction w/ intraocular lens  implant, bilateral Bilateral   . Kidney stone surgery  09/2009   Social History:   reports that he quit smoking about 74 years ago. His smoking use included Pipe. He has never used smokeless tobacco. He reports that he drinks alcohol. He reports that he does not use illicit drugs.  Family History  Problem Relation Age of Onset  . Heart disease Mother   . Emphysema Father     Medications: Patient's Medications  New Prescriptions   No medications on file  Previous Medications   ASPIRIN EC 81 MG TABLET    Take 81 mg by mouth daily.   LATANOPROST (XALATAN) 0.005 % OPHTHALMIC SOLUTION    Place 1 drop into both eyes at bedtime. Wait 3-5 minutes between eye drops   LORATADINE (CLARITIN) 10 MG TABLET    Take 10 mg by mouth daily as needed.    MORPHINE (ROXANOL) 20 MG/ML CONCENTRATED SOLUTION    Take 5 mg by mouth every 2 (two) hours as needed for severe pain. tid   ONDANSETRON (ZOFRAN) 4 MG TABLET    Take 4 mg by mouth every 6 (six) hours. Every 6 hours as needed for nausea.   POLYETHYLENE GLYCOL (MIRALAX / GLYCOLAX) PACKET    Take 17 g by mouth daily as needed. Fill cap to 17 gm mark, mix with 4-6 ounces of water and take by mouth every day as needed for constipation.  Modified Medications   No medications on file  Discontinued Medications   CITALOPRAM (CELEXA) 10 MG TABLET    Take 20 mg by mouth daily.    FINASTERIDE (PROSCAR) 5 MG TABLET    Take 5 mg by mouth daily. *Do Not Crush* Wear gloves when handling to avoid exposure.   HYDROCODONE-ACETAMINOPHEN (NORCO/VICODIN) 5-325 MG PER TABLET    Take 1/2 tablet by mouth three times daily for pain   SENNA-DOCUSATE (SENOKOT S) 8.6-50 MG PER TABLET    Take 1 tablet by mouth daily.    TAMSULOSIN (FLOMAX) 0.4 MG CAPS    Take 0.4 mg by mouth daily. *Do not crush* * Do not open capsule** May discharge if patient passes stone.*     Physical Exam: Physical Exam  Constitutional: He is oriented to  person, place, and time. He appears well-developed and well-nourished.  HENT:  Head: Normocephalic and atraumatic.  Eyes: Conjunctivae and EOM are normal. Pupils are equal, round, and reactive to light.  Neck: Normal range of motion. Neck supple. No JVD present. No thyromegaly present.  Cardiovascular: Normal rate, regular rhythm and normal heart sounds.   No murmur heard. Pulmonary/Chest: Effort normal. He has decreased breath sounds. He has no wheezes. He has no rales. He exhibits no tenderness.  Abdominal: Soft. Bowel sounds are  normal. There is no tenderness.  Musculoskeletal: Normal range of motion. He exhibits no edema.  The right shoulder pain with over head ROM  Lymphadenopathy:    He has no cervical adenopathy.  Neurological: He is alert and oriented to person, place, and time. He has normal reflexes. No cranial nerve deficit. He exhibits normal muscle tone. Coordination normal.  Skin: Skin is warm and dry. No rash noted. No erythema.  Psychiatric: His mood appears not anxious. His affect is not angry, not blunt, not labile and not inappropriate. His speech is not rapid and/or pressured, not delayed, not tangential and not slurred. He is agitated, slowed and combative. He is not aggressive, not hyperactive, not withdrawn and not actively hallucinating. Thought content is not paranoid and not delusional. Cognition and memory are impaired. He expresses impulsivity and inappropriate judgment. He does not exhibit a depressed mood. He exhibits abnormal recent memory.    Filed Vitals:   12/10/13 1648  BP: 134/92  Pulse: 72  Temp: 98 F (36.7 C)  TempSrc: Tympanic  Resp: 18      Labs reviewed: Basic Metabolic Panel:  Recent Labs  12/19/12  05/26/13 09/24/13 09/24/13 2031  NA  --   < > 137 138 138  K  --   < > 3.8 3.7 3.7  CL  --   --   --   --  100  CO2  --   --   --   --  29  GLUCOSE  --   --   --   --  112*  BUN  --   < > 16 17 17   CREATININE  --   < > 0.9 0.8 0.83    CALCIUM  --   --   --   --  9.9  TSH 1.70  --   --   --   --   < > = values in this interval not displayed. Liver Function Tests:  Recent Labs  03/27/13 05/26/13  AST 18 16  ALT 8* 8*  ALKPHOS 51 57   CBC:  Recent Labs  12/19/12 05/26/13 09/24/13 09/24/13 2031  WBC 6.8 7.7 5.3 5.3  NEUTROABS  --   --   --  3.9  HGB 12.2* 12.5*  --  12.9*  HCT 36* 36*  --  37.3*  MCV  --   --   --  95.6  PLT 217 225  --  211    Past Procedures:  06/07/13 X-ray R clavicle, humerus, shoulder: no fracture or dislocation seen. Mild degenerative change.   06/12/13 EKG: regular RR, PR 204, QT 420.   Dg Chest 1 View  09/24/2013 CLINICAL DATA: Fall and complaining of pain. EXAM: CHEST - 1 VIEW  IMPRESSION: Chronic lung changes without acute findings.   Dg Thoracic Spine 2 View  09/24/2013 CLINICAL DATA: Fall and complaining of pain. EXAM: THORACIC SPINE - 2 VIEW IMPRESSION: No acute bone abnormality in the thoracic spine.   Dg Lumbar Spine Complete  09/24/2013 CLINICAL DATA: Fall and pain. EXAM: LUMBAR SPINE - COMPLETE 4+ VIEW IMPRESSION: No acute bone abnormality in the lumbar spine. Right kidney stones.   Dg Hip Bilateral W/pelvis  09/24/2013 CLINICAL DATA: Fall and complaining of pain. EXAM: BILATERAL HIP WITH PELVIS - 4+ VIEW COMPARISON: IMPRESSION: No acute bone abnormality in the pelvis or hips.   Ct Head Wo Contrast  09/24/2013 CLINICAL DATA: Status post fall. EXAM: CT HEAD WITHOUT CONTRAST CT CERVICAL SPINE WITHOUT CONTRAST IMPRESSION: Scalp  hematoma on the left without underlying fracture or acute intracranial abnormality. No acute abnormality cervical spine. Atrophy and chronic microvascular ischemic change. Multilevel cervical spondylosis.   Ct Cervical Spine Wo Contrast  09/24/2013 CLINICAL DATA: Status post fall. EXAM: CT HEAD WITHOUT CONTRAST CT CERVICAL SPINE WITHOUT CONTRAST IMPRESSION: Scalp hematoma on the left without underlying fracture or acute intracranial abnormality. No acute  abnormality cervical spine. Atrophy and chronic microvascular ischemic change. Multilevel cervical spondylosis.   Assessment/Plan Dementia unspecified, combative when assisted with personal care-less now due to increased frailty.  Stopped Exelon and Namenda, transferred out  Memory Care Unit. Lack of safety awareness and increased frailty contribute to his falling.     Asthma, chronic Stable, off Synbicort   Depression Most of time he is stupor-dc Celexa.      GERD (gastroesophageal reflux disease) Vomited 12/05/13 and 12/06/13-better since prn Zofran available to him and Norco was switched to Morphine   FTT (failure to thrive) in adult Persisted.   Unspecified constipation Prn daily MiraLax  Urinary frequency No change since off Flomax and Proscar.   Osteoarthritis Well managed with Morphine 5mg  tid and q2h prn     Family/ Staff Communication: observe the patient  Goals of Care: SNF  Labs/tests ordered: none

## 2013-12-19 DEATH — deceased

## 2014-12-03 IMAGING — CR DG THORACIC SPINE 2V
3 series · 3 of 3 positions shown · non-contrast
Comparison: Chest 10/15/2012

CLINICAL DATA: Fall and complaining of pain.

EXAM:
THORACIC SPINE - 2 VIEW

[t t-spine a.p.]
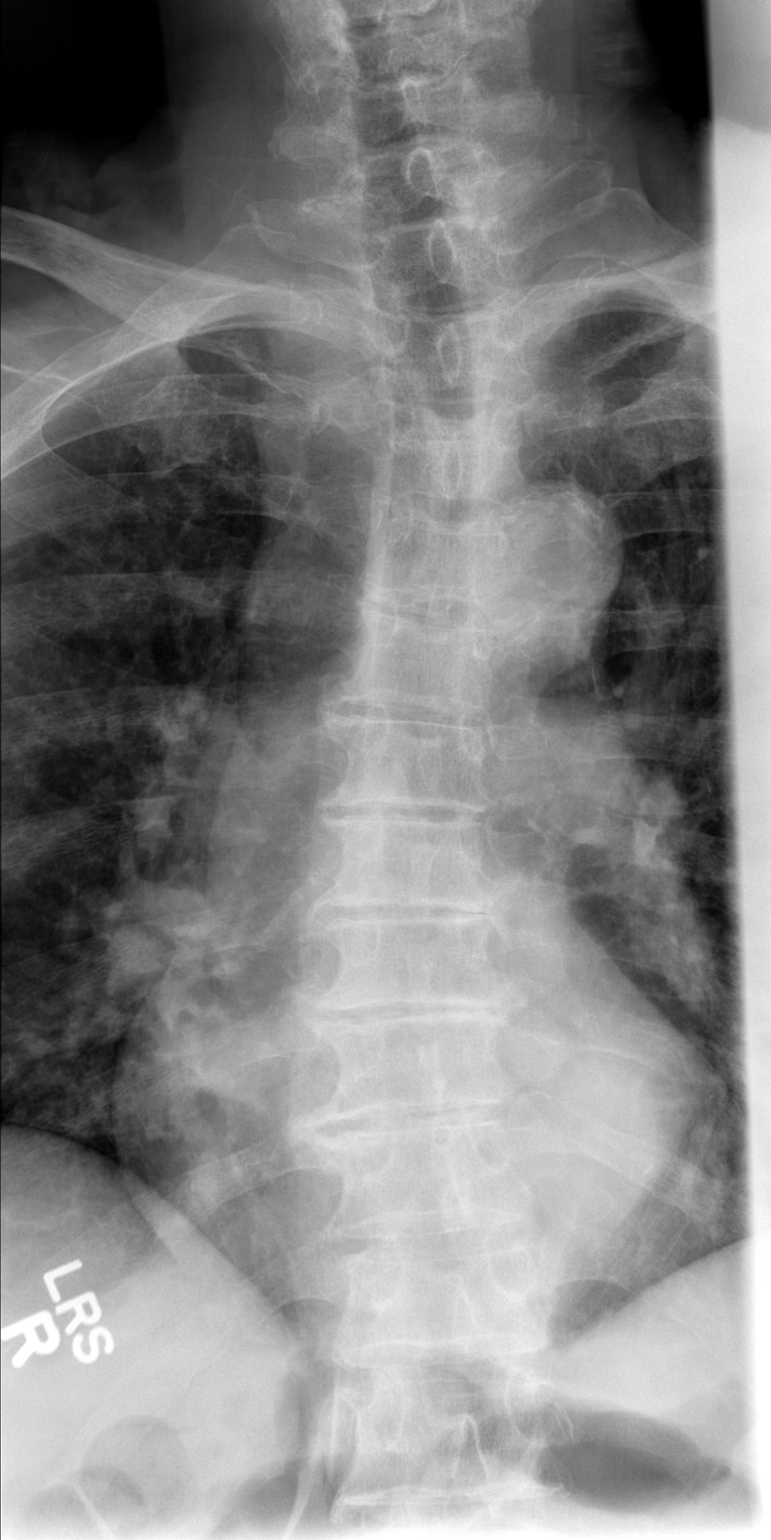

[w t-spine lat *]
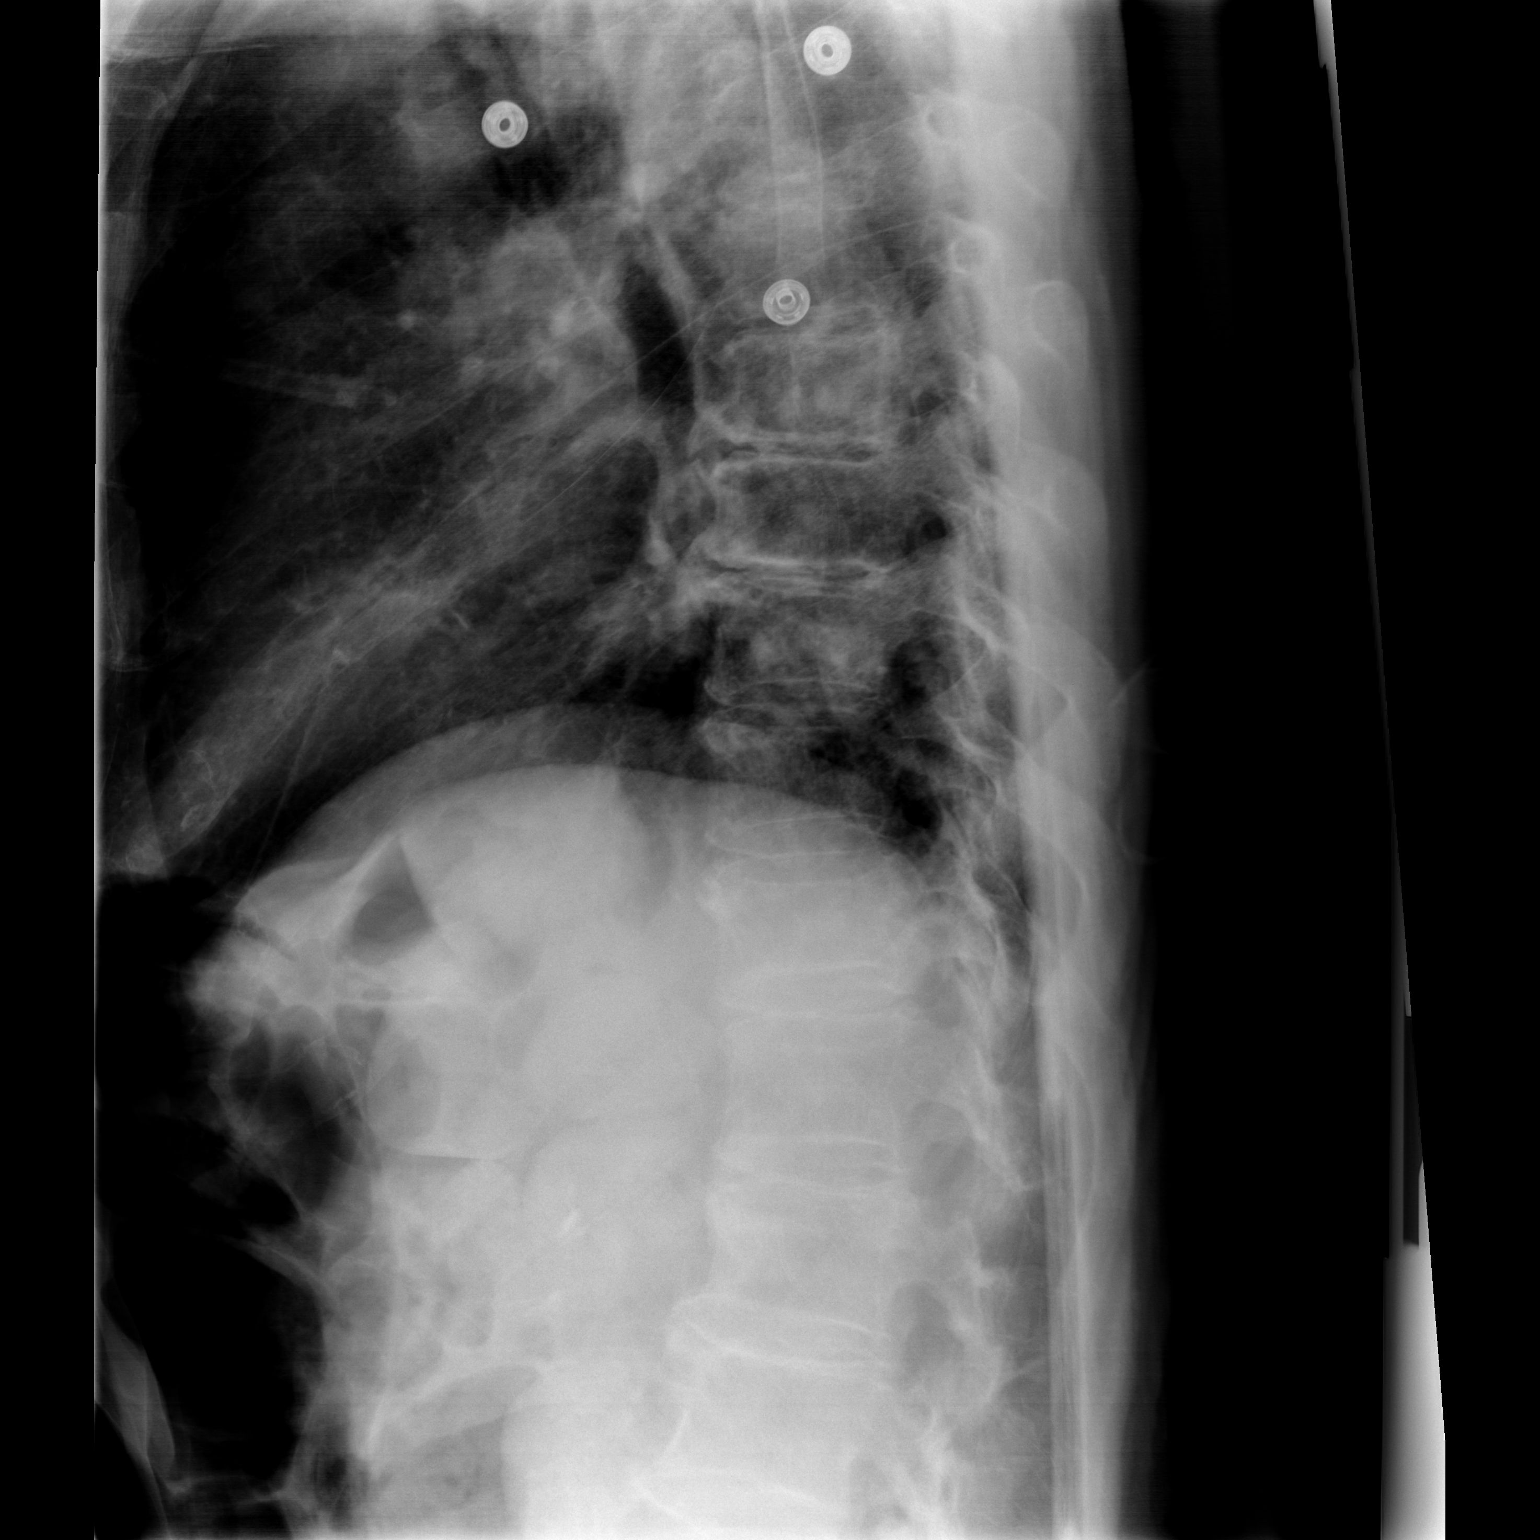

[w swimmers view *]
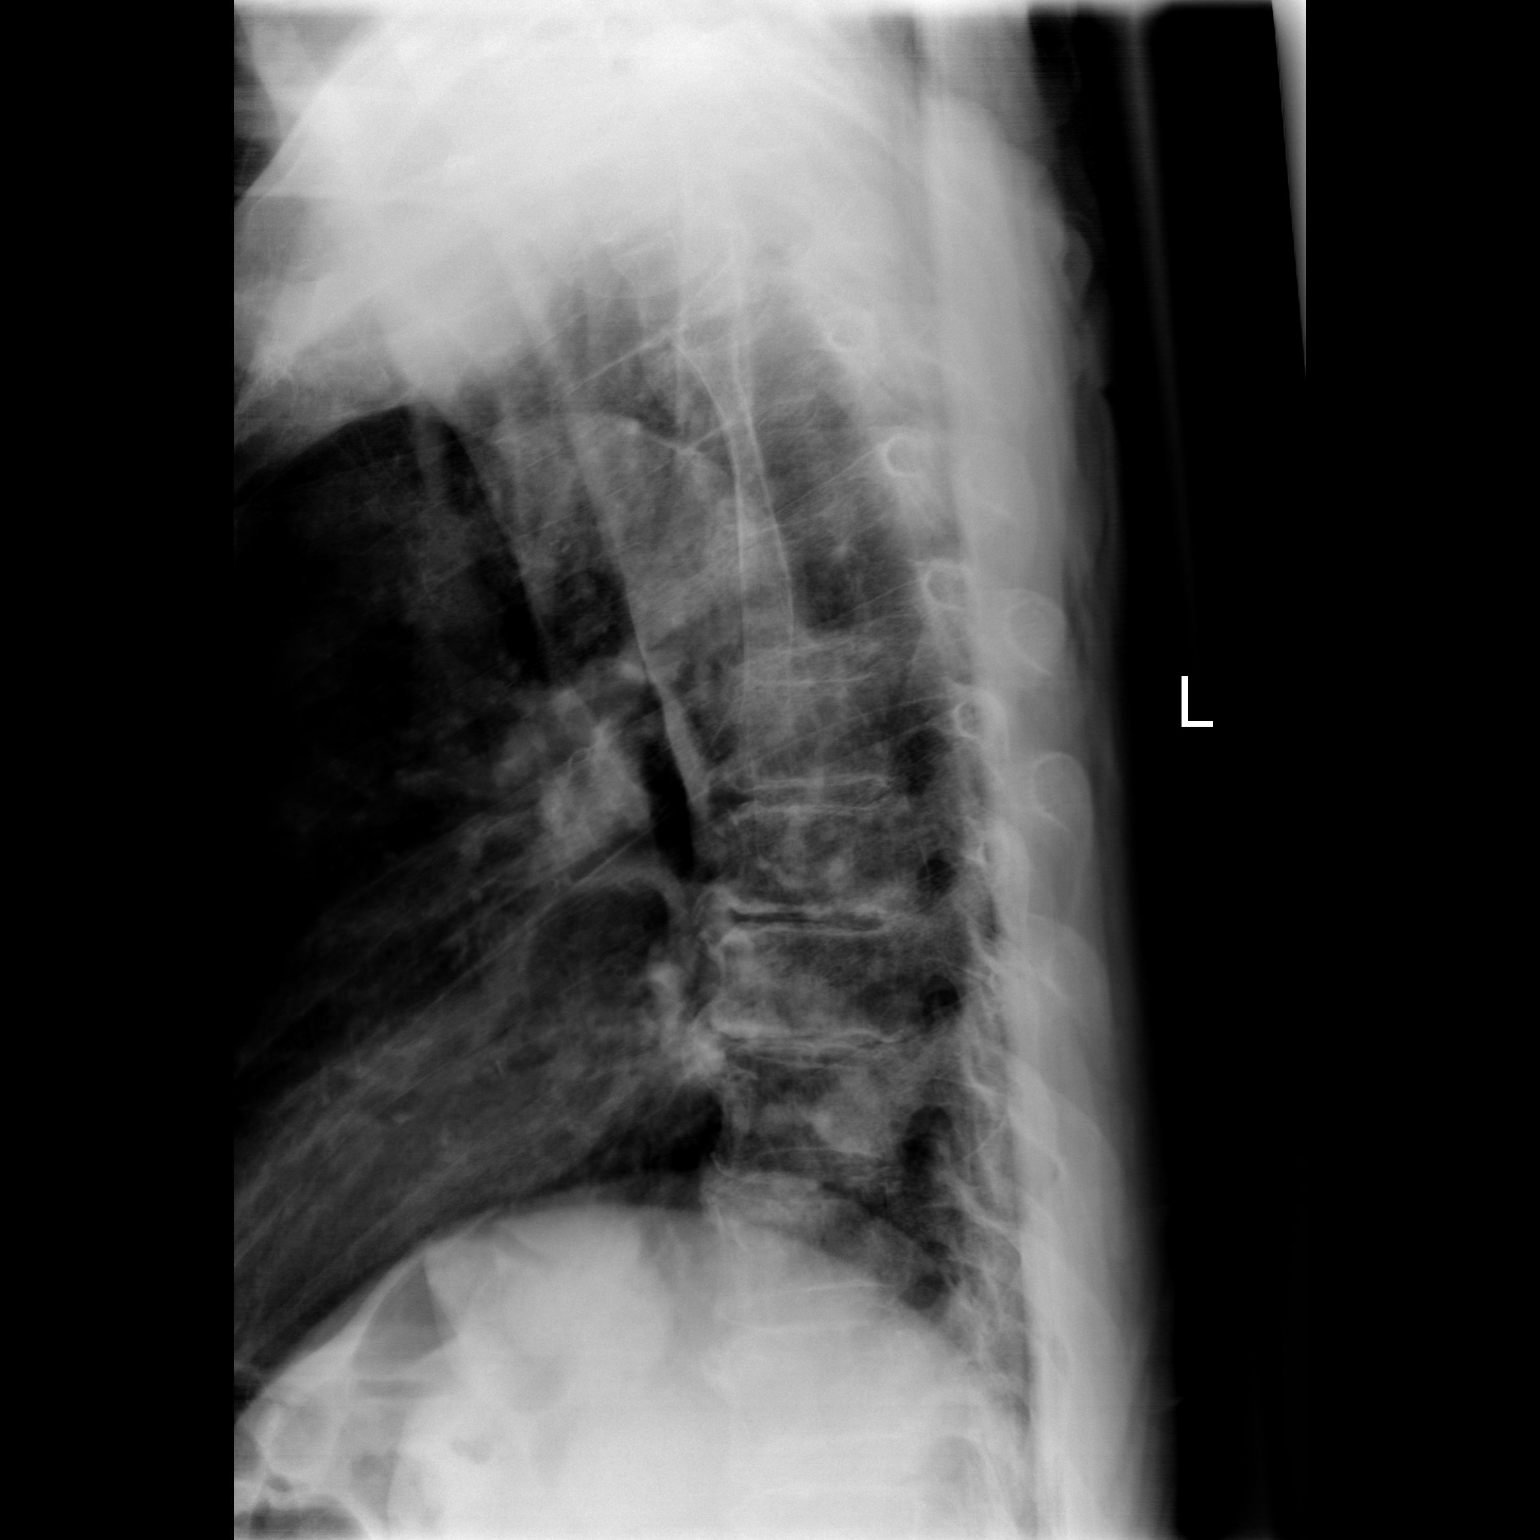

[3 of 3 positions shown; findings below may reference images not displayed]

FINDINGS: Two views of the chest demonstrate mild curvature in the thoracic
spine. Degenerative endplate changes. The vertebral body heights are
maintained. No evidence for an acute fracture.
IMPRESSION: No acute bone abnormality in the thoracic spine.

## 2014-12-03 IMAGING — CT CT HEAD W/O CM
3 of 5 series · 16 of 47 positions shown, 19 images · non-contrast
Comparison: None.

CLINICAL DATA: Status post fall.

EXAM:
CT HEAD WITHOUT CONTRAST
CT CERVICAL SPINE WITHOUT CONTRAST
TECHNIQUE: Multidetector CT imaging of the head and cervical spine was
performed following the standard protocol without intravenous
contrast. Multiplanar CT image reconstructions of the cervical spine
were also generated.

[Series 5: soft tissue · axial · 0.39mm/px · z∈[+58,+254]mm · 10 of 113 slices shown, 13 images]
[im 8/113  brain]
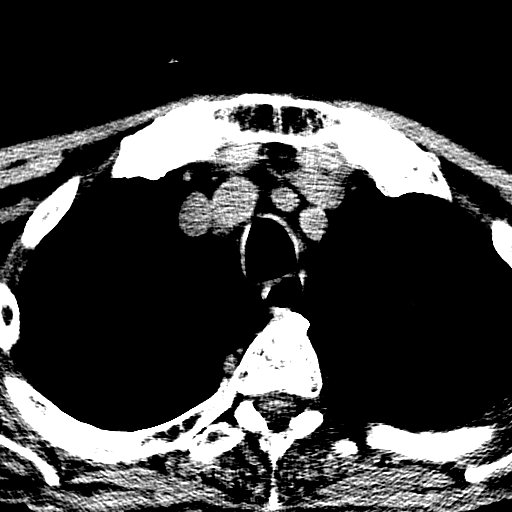
[im 8/113  bone]
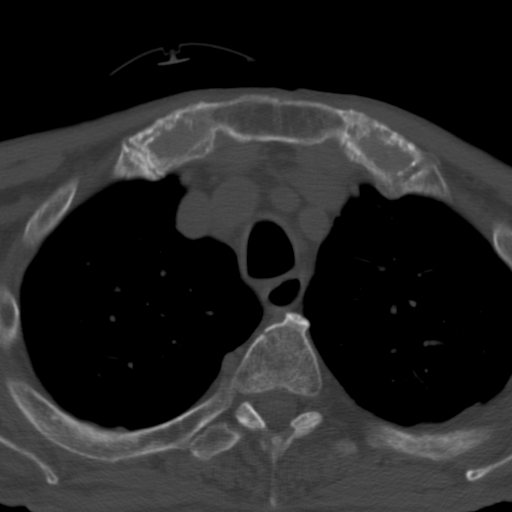
[im 23/113  brain]
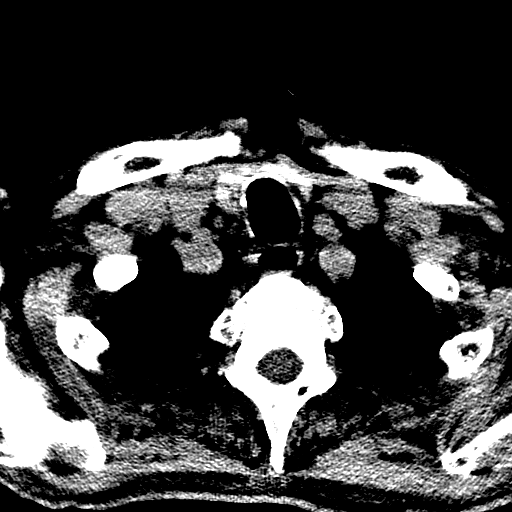
[im 30/113  brain]
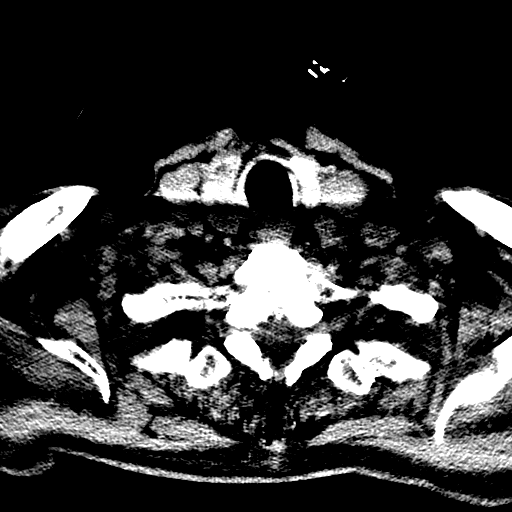
[im 38/113  brain]
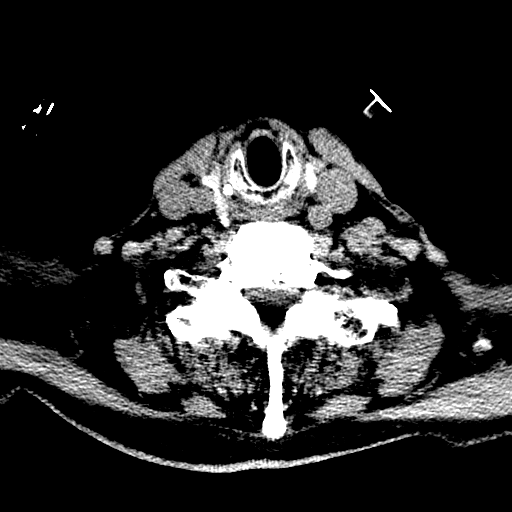
[im 53/113  brain]
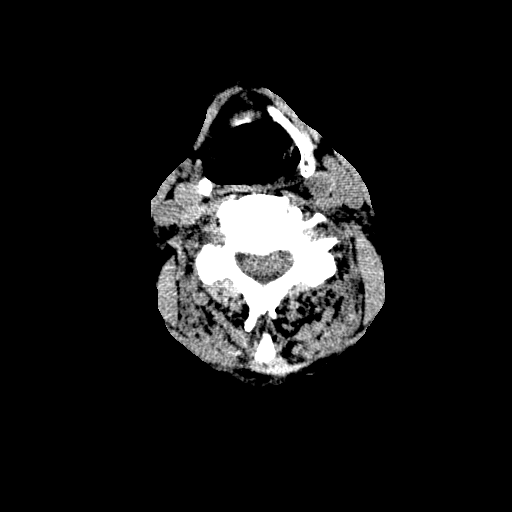
[im 53/113  bone]
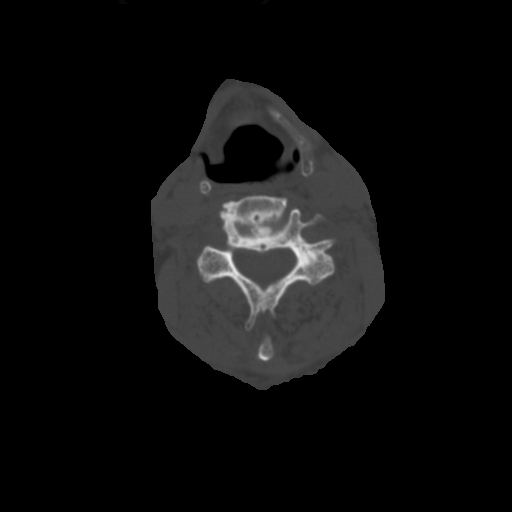
[im 60/113  brain]
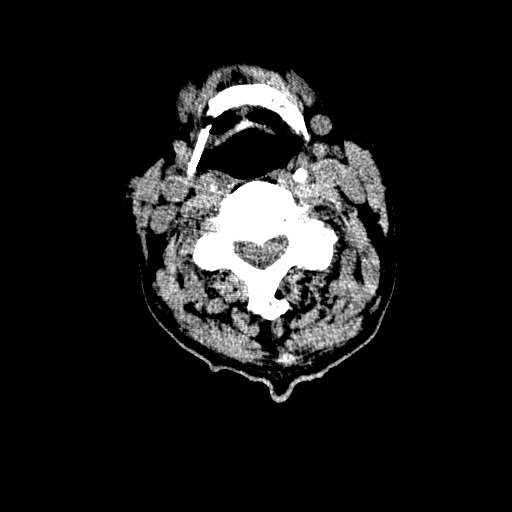
[im 75/113  brain]
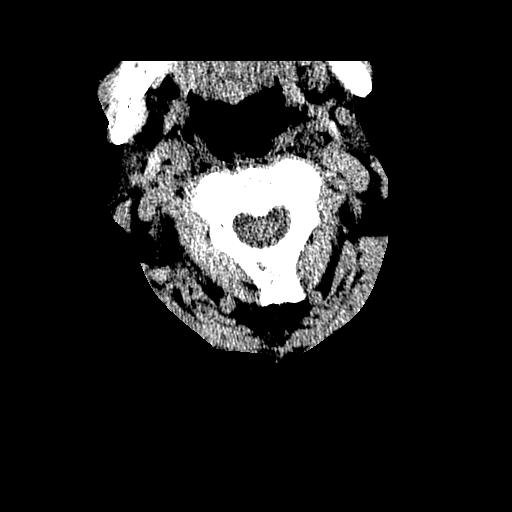
[im 83/113  brain]
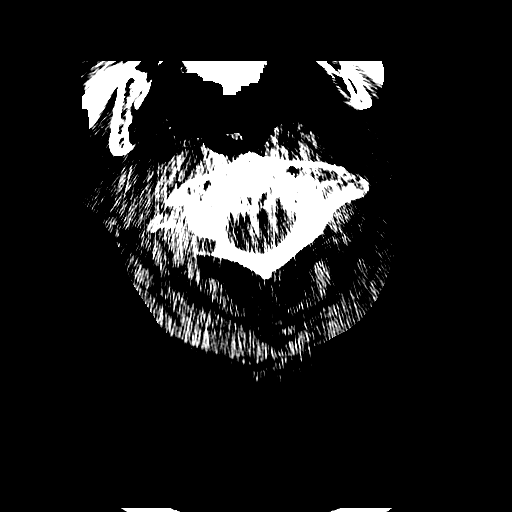
[im 90/113  brain]
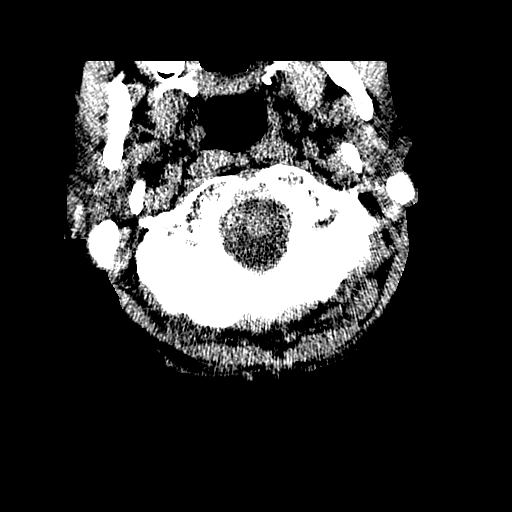
[im 90/113  bone]
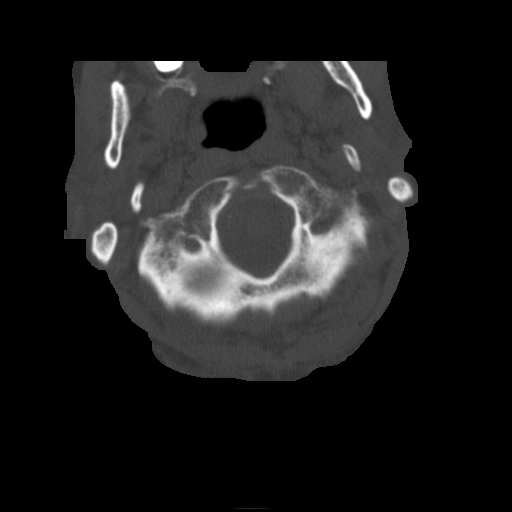
[im 105/113  brain]
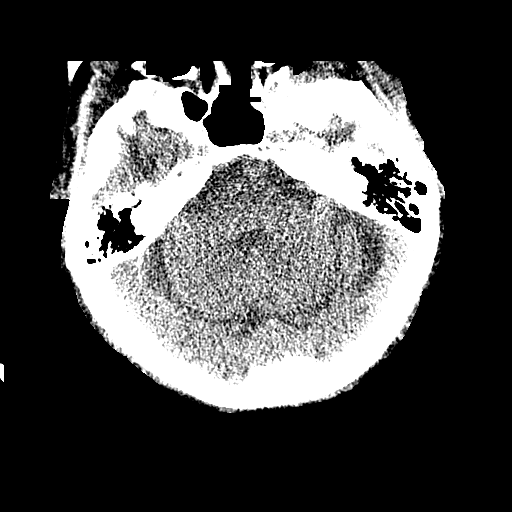

[mpr, sagittal, sagittal · sagittal · 0.44mm/px · 3 of 62 slices shown]
[im 21/62  brain]
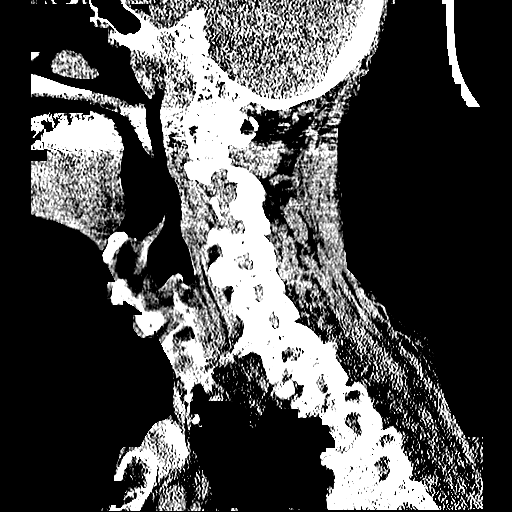
[im 31/62  brain]
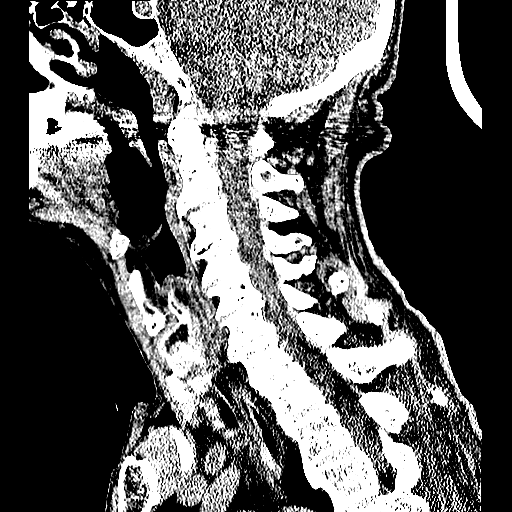
[im 41/62  brain]
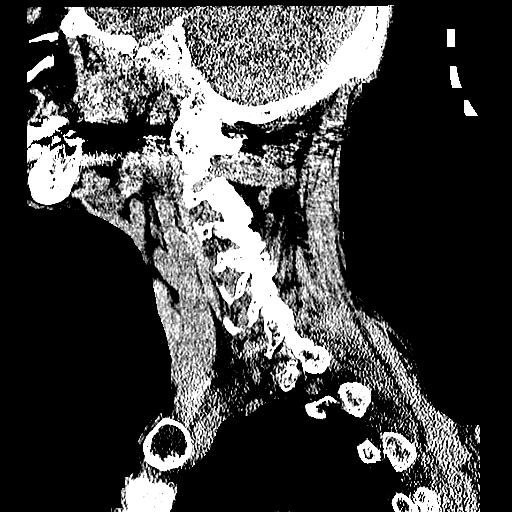

[mpr, coronal, coronal · coronal · 0.44mm/px · 3 of 77 slices shown]
[im 26/77  brain]
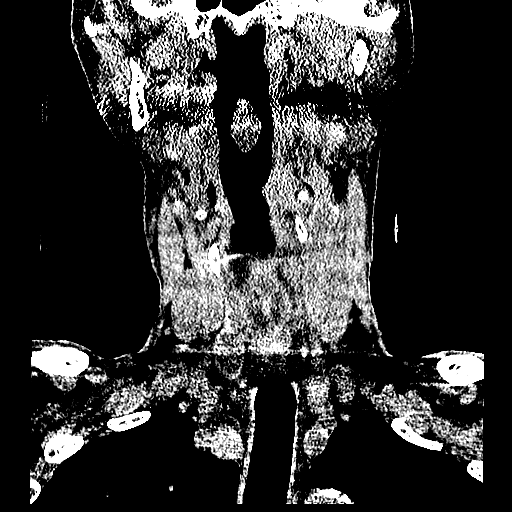
[im 34/77  brain]
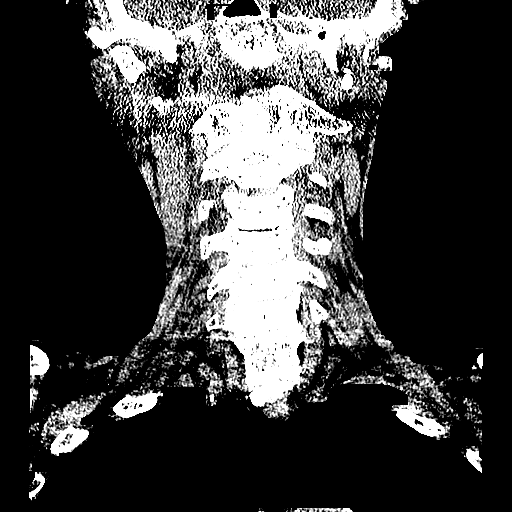
[im 43/77  brain]
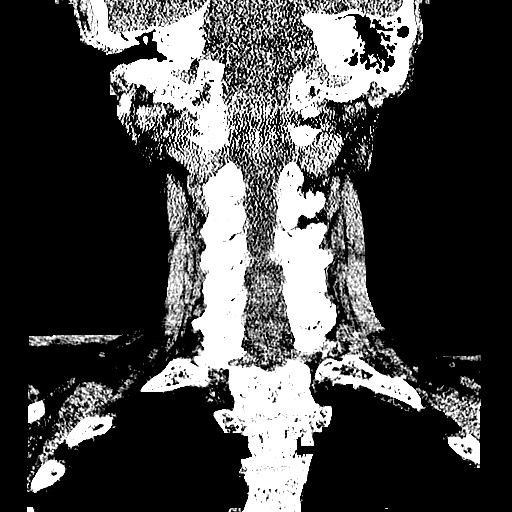

[16 of 47 positions shown; findings below may reference images not displayed]

FINDINGS: CT HEAD FINDINGS

The brain is atrophic with chronic microvascular ischemic change.
Scalp hematoma on the left is identified. There is no underlying
fracture or foreign body. No evidence of acute intracranial
abnormality including infarct, hemorrhage, mass lesion, mass effect,
midline shift or abnormal extra-axial fluid collection is seen.
There is no hydrocephalus or pneumocephalus.

CT CERVICAL SPINE FINDINGS

No fracture or malalignment of the cervical spine is identified.
Multilevel cervical spondylosis is noted. Lung apices are clear.
IMPRESSION: Scalp hematoma on the left without underlying fracture or acute
intracranial abnormality.

No acute abnormality cervical spine.

Atrophy and chronic microvascular ischemic change. Multilevel
cervical spondylosis.
